# Patient Record
Sex: Male | Born: 1975
Health system: Southern US, Community
[De-identification: ages and names within clinical notes are randomized; demographics above are authoritative.]

## PROBLEM LIST (undated history)

## (undated) DIAGNOSIS — K635 Polyp of colon: Secondary | ICD-10-CM

## (undated) DIAGNOSIS — G4730 Sleep apnea, unspecified: Secondary | ICD-10-CM

## (undated) DIAGNOSIS — K439 Ventral hernia without obstruction or gangrene: Secondary | ICD-10-CM

## (undated) DIAGNOSIS — Z720 Tobacco use: Secondary | ICD-10-CM

## (undated) DIAGNOSIS — J45909 Unspecified asthma, uncomplicated: Secondary | ICD-10-CM

## (undated) DIAGNOSIS — I10 Essential (primary) hypertension: Secondary | ICD-10-CM

## (undated) DIAGNOSIS — F952 Tourette's disorder: Secondary | ICD-10-CM

## (undated) HISTORY — DX: Tobacco use: Z72.0

## (undated) HISTORY — PX: ABDOMINAL HERNIA REPAIR: SHX539

## (undated) HISTORY — DX: Tourette's disorder: F95.2

## (undated) HISTORY — DX: Polyp of colon: K63.5

## (undated) HISTORY — DX: Unspecified asthma, uncomplicated: J45.909

## (undated) HISTORY — DX: Ventral hernia without obstruction or gangrene: K43.9

## (undated) HISTORY — DX: Essential (primary) hypertension: I10

## (undated) HISTORY — DX: Morbid (severe) obesity due to excess calories: E66.01

---

## 2003-11-28 ENCOUNTER — Other Ambulatory Visit: Payer: Self-pay

## 2006-08-07 HISTORY — PX: COLONOSCOPY: SHX174

## 2006-11-21 ENCOUNTER — Emergency Department: Payer: Self-pay | Admitting: Emergency Medicine

## 2007-03-20 ENCOUNTER — Ambulatory Visit: Payer: Self-pay | Admitting: Gastroenterology

## 2007-06-17 ENCOUNTER — Ambulatory Visit: Payer: Self-pay | Admitting: Family Medicine

## 2007-06-17 DIAGNOSIS — J45909 Unspecified asthma, uncomplicated: Secondary | ICD-10-CM | POA: Insufficient documentation

## 2007-06-17 DIAGNOSIS — F952 Tourette's disorder: Secondary | ICD-10-CM

## 2007-06-17 LAB — CONVERTED CEMR LAB
Glucose, Urine, Semiquant: NEGATIVE
Ketones, urine, test strip: NEGATIVE
Protein, U semiquant: NEGATIVE
Specific Gravity, Urine: 1.01
Urobilinogen, UA: 0.2

## 2007-06-18 ENCOUNTER — Encounter (INDEPENDENT_AMBULATORY_CARE_PROVIDER_SITE_OTHER): Payer: Self-pay | Admitting: Family Medicine

## 2007-06-19 ENCOUNTER — Telehealth (INDEPENDENT_AMBULATORY_CARE_PROVIDER_SITE_OTHER): Payer: Self-pay | Admitting: *Deleted

## 2007-06-19 LAB — CONVERTED CEMR LAB
ALT: 30 units/L (ref 0–53)
Albumin: 4.3 g/dL (ref 3.5–5.2)
Alkaline Phosphatase: 123 units/L — ABNORMAL HIGH (ref 39–117)
Basophils Absolute: 0 10*3/uL (ref 0.0–0.1)
CO2: 25 meq/L (ref 19–32)
Creatinine, Ser: 1.12 mg/dL (ref 0.40–1.50)
Eosinophils Absolute: 0.1 10*3/uL — ABNORMAL LOW (ref 0.2–0.7)
Glucose, Bld: 94 mg/dL (ref 70–99)
Hemoglobin: 14.5 g/dL (ref 13.0–17.0)
LDL Cholesterol: 124 mg/dL — ABNORMAL HIGH (ref 0–99)
Platelets: 181 10*3/uL (ref 150–400)
Potassium: 4.1 meq/L (ref 3.5–5.3)
RBC: 5.07 M/uL (ref 4.22–5.81)
TSH: 2.459 microintl units/mL (ref 0.350–5.50)
Total Bilirubin: 0.7 mg/dL (ref 0.3–1.2)
Total CHOL/HDL Ratio: 3.8
Triglycerides: 94 mg/dL (ref <150)
VLDL: 19 mg/dL (ref 0–40)

## 2007-06-27 ENCOUNTER — Encounter (INDEPENDENT_AMBULATORY_CARE_PROVIDER_SITE_OTHER): Payer: Self-pay | Admitting: Family Medicine

## 2007-07-09 ENCOUNTER — Encounter (INDEPENDENT_AMBULATORY_CARE_PROVIDER_SITE_OTHER): Payer: Self-pay | Admitting: Family Medicine

## 2007-07-18 ENCOUNTER — Ambulatory Visit: Payer: Self-pay | Admitting: Family Medicine

## 2007-07-18 DIAGNOSIS — I10 Essential (primary) hypertension: Secondary | ICD-10-CM | POA: Insufficient documentation

## 2007-07-18 LAB — CONVERTED CEMR LAB
Cholesterol, target level: 200 mg/dL
HDL goal, serum: 40 mg/dL
LDL Goal: 160 mg/dL

## 2007-08-27 ENCOUNTER — Emergency Department (HOSPITAL_COMMUNITY): Admission: EM | Admit: 2007-08-27 | Discharge: 2007-08-28 | Payer: Self-pay | Admitting: Emergency Medicine

## 2007-12-19 ENCOUNTER — Emergency Department (HOSPITAL_COMMUNITY): Admission: EM | Admit: 2007-12-19 | Discharge: 2007-12-20 | Payer: Self-pay | Admitting: Emergency Medicine

## 2008-02-08 ENCOUNTER — Emergency Department (HOSPITAL_COMMUNITY): Admission: EM | Admit: 2008-02-08 | Discharge: 2008-02-08 | Payer: Self-pay | Admitting: Emergency Medicine

## 2008-02-19 ENCOUNTER — Ambulatory Visit: Payer: Self-pay | Admitting: Family Medicine

## 2008-03-30 ENCOUNTER — Ambulatory Visit: Payer: Self-pay | Admitting: Family Medicine

## 2008-05-27 ENCOUNTER — Encounter (INDEPENDENT_AMBULATORY_CARE_PROVIDER_SITE_OTHER): Payer: Self-pay | Admitting: Family Medicine

## 2008-06-23 ENCOUNTER — Ambulatory Visit: Payer: Self-pay | Admitting: Family Medicine

## 2008-06-23 LAB — CONVERTED CEMR LAB
Blood in Urine, dipstick: NEGATIVE
Glucose, Urine, Semiquant: NEGATIVE
Ketones, urine, test strip: NEGATIVE
WBC Urine, dipstick: NEGATIVE
pH: 7

## 2008-07-08 ENCOUNTER — Telehealth (INDEPENDENT_AMBULATORY_CARE_PROVIDER_SITE_OTHER): Payer: Self-pay | Admitting: Family Medicine

## 2008-07-10 ENCOUNTER — Encounter (INDEPENDENT_AMBULATORY_CARE_PROVIDER_SITE_OTHER): Payer: Self-pay | Admitting: *Deleted

## 2008-07-10 ENCOUNTER — Encounter (INDEPENDENT_AMBULATORY_CARE_PROVIDER_SITE_OTHER): Payer: Self-pay | Admitting: Family Medicine

## 2008-08-28 ENCOUNTER — Ambulatory Visit: Payer: Self-pay | Admitting: Family Medicine

## 2008-08-29 ENCOUNTER — Encounter (INDEPENDENT_AMBULATORY_CARE_PROVIDER_SITE_OTHER): Payer: Self-pay | Admitting: Family Medicine

## 2008-08-31 ENCOUNTER — Telehealth (INDEPENDENT_AMBULATORY_CARE_PROVIDER_SITE_OTHER): Payer: Self-pay | Admitting: *Deleted

## 2008-08-31 LAB — CONVERTED CEMR LAB
AST: 19 units/L (ref 0–37)
Albumin: 4.2 g/dL (ref 3.5–5.2)
Alkaline Phosphatase: 139 units/L — ABNORMAL HIGH (ref 39–117)
Basophils Relative: 1 % (ref 0–1)
CO2: 20 meq/L (ref 19–32)
Cholesterol: 171 mg/dL (ref 0–200)
Creatinine, Ser: 1.01 mg/dL (ref 0.40–1.50)
HDL: 54 mg/dL (ref >39)
Lymphocytes Relative: 33 % (ref 12–46)
MCHC: 34 g/dL (ref 30.0–36.0)
MCV: 82.1 fL (ref 78.0–100.0)
Neutro Abs: 2.3 10*3/uL (ref 1.7–7.7)
Platelets: 202 10*3/uL (ref 150–400)
RBC: 5.19 M/uL (ref 4.22–5.81)
Triglycerides: 85 mg/dL (ref <150)
WBC: 4.3 10*3/uL (ref 4.0–10.5)

## 2008-09-11 ENCOUNTER — Emergency Department (HOSPITAL_COMMUNITY): Admission: EM | Admit: 2008-09-11 | Discharge: 2008-09-11 | Payer: Self-pay | Admitting: Emergency Medicine

## 2008-09-18 ENCOUNTER — Telehealth (INDEPENDENT_AMBULATORY_CARE_PROVIDER_SITE_OTHER): Payer: Self-pay | Admitting: *Deleted

## 2008-10-02 ENCOUNTER — Emergency Department (HOSPITAL_COMMUNITY): Admission: EM | Admit: 2008-10-02 | Discharge: 2008-10-02 | Payer: Self-pay | Admitting: Emergency Medicine

## 2008-10-30 ENCOUNTER — Emergency Department (HOSPITAL_COMMUNITY): Admission: EM | Admit: 2008-10-30 | Discharge: 2008-10-30 | Payer: Self-pay | Admitting: Emergency Medicine

## 2008-11-30 ENCOUNTER — Encounter (INDEPENDENT_AMBULATORY_CARE_PROVIDER_SITE_OTHER): Payer: Self-pay | Admitting: Family Medicine

## 2009-02-11 ENCOUNTER — Ambulatory Visit: Payer: Self-pay | Admitting: Family Medicine

## 2009-02-12 ENCOUNTER — Encounter (INDEPENDENT_AMBULATORY_CARE_PROVIDER_SITE_OTHER): Payer: Self-pay | Admitting: Family Medicine

## 2009-02-12 LAB — CONVERTED CEMR LAB
Alkaline Phosphatase: 137 units/L — ABNORMAL HIGH (ref 39–117)
CO2: 22 meq/L (ref 19–32)
Calcium: 9 mg/dL (ref 8.4–10.5)
Creatinine, Ser: 1 mg/dL (ref 0.40–1.50)
Glucose, Bld: 92 mg/dL (ref 70–99)
Potassium: 4.2 meq/L (ref 3.5–5.3)
Total Bilirubin: 0.5 mg/dL (ref 0.3–1.2)
Total Protein: 7.2 g/dL (ref 6.0–8.3)

## 2009-03-15 ENCOUNTER — Telehealth (INDEPENDENT_AMBULATORY_CARE_PROVIDER_SITE_OTHER): Payer: Self-pay | Admitting: Family Medicine

## 2009-05-06 ENCOUNTER — Emergency Department (HOSPITAL_COMMUNITY): Admission: EM | Admit: 2009-05-06 | Discharge: 2009-05-06 | Payer: Self-pay | Admitting: Emergency Medicine

## 2009-05-10 ENCOUNTER — Emergency Department (HOSPITAL_COMMUNITY): Admission: EM | Admit: 2009-05-10 | Discharge: 2009-05-10 | Payer: Self-pay | Admitting: Emergency Medicine

## 2009-08-07 HISTORY — PX: COLONOSCOPY: SHX174

## 2009-12-06 ENCOUNTER — Ambulatory Visit: Payer: Self-pay | Admitting: Gastroenterology

## 2010-08-17 ENCOUNTER — Emergency Department (HOSPITAL_COMMUNITY)
Admission: EM | Admit: 2010-08-17 | Discharge: 2010-08-17 | Payer: Self-pay | Source: Home / Self Care | Admitting: Emergency Medicine

## 2010-09-20 ENCOUNTER — Emergency Department (HOSPITAL_COMMUNITY)
Admission: EM | Admit: 2010-09-20 | Discharge: 2010-09-20 | Disposition: A | Discharge status: Home / Self Care | Payer: Medicare Other | Attending: Emergency Medicine | Admitting: Emergency Medicine

## 2010-09-20 ENCOUNTER — Emergency Department (HOSPITAL_COMMUNITY): Payer: Medicare Other

## 2010-09-20 DIAGNOSIS — Z79899 Other long term (current) drug therapy: Secondary | ICD-10-CM | POA: Insufficient documentation

## 2010-09-20 DIAGNOSIS — J45909 Unspecified asthma, uncomplicated: Secondary | ICD-10-CM | POA: Insufficient documentation

## 2010-09-20 DIAGNOSIS — I4949 Other premature depolarization: Secondary | ICD-10-CM | POA: Insufficient documentation

## 2010-09-20 LAB — RAPID URINE DRUG SCREEN, HOSP PERFORMED
Amphetamines: NOT DETECTED
Cocaine: NOT DETECTED
Opiates: NOT DETECTED

## 2010-09-20 LAB — URINALYSIS, ROUTINE W REFLEX MICROSCOPIC
Bilirubin Urine: NEGATIVE
Hgb urine dipstick: NEGATIVE
Ketones, ur: NEGATIVE mg/dL
Protein, ur: NEGATIVE mg/dL
Urine Glucose, Fasting: NEGATIVE mg/dL

## 2010-09-20 LAB — DIFFERENTIAL
Eosinophils Absolute: 0.2 10*3/uL (ref 0.0–0.7)
Lymphocytes Relative: 31 % (ref 12–46)
Lymphs Abs: 1.6 10*3/uL (ref 0.7–4.0)
Monocytes Absolute: 0.6 10*3/uL (ref 0.1–1.0)
Neutro Abs: 2.8 10*3/uL (ref 1.7–7.7)
Neutrophils Relative %: 54 % (ref 43–77)

## 2010-09-20 LAB — COMPREHENSIVE METABOLIC PANEL
ALT: 42 U/L (ref 0–53)
BUN: 9 mg/dL (ref 6–23)
Chloride: 101 mEq/L (ref 96–112)
Sodium: 139 mEq/L (ref 135–145)
Total Bilirubin: 0.7 mg/dL (ref 0.3–1.2)
Total Protein: 7.3 g/dL (ref 6.0–8.3)

## 2010-09-20 LAB — CBC
HCT: 43.2 % (ref 39.0–52.0)
Hemoglobin: 14.2 g/dL (ref 13.0–17.0)
MCH: 28 pg (ref 26.0–34.0)
MCHC: 32.9 g/dL (ref 30.0–36.0)
MCV: 85.2 fL (ref 78.0–100.0)
RBC: 5.07 MIL/uL (ref 4.22–5.81)
WBC: 5.2 10*3/uL (ref 4.0–10.5)

## 2010-09-20 LAB — POCT CARDIAC MARKERS: CKMB, poc: <1 ng/mL — ABNORMAL LOW (ref 1.0–8.0)

## 2010-11-10 LAB — CBC
HCT: 37 % — ABNORMAL LOW (ref 39.0–52.0)
Hemoglobin: 12.7 g/dL — ABNORMAL LOW (ref 13.0–17.0)
MCV: 84.6 fL (ref 78.0–100.0)
Platelets: 183 10*3/uL (ref 150–400)
RDW: 13.8 % (ref 11.5–15.5)

## 2010-11-10 LAB — BASIC METABOLIC PANEL
BUN: 11 mg/dL (ref 6–23)
Calcium: 8.9 mg/dL (ref 8.4–10.5)
GFR calc Af Amer: >60 mL/min (ref >60)
Glucose, Bld: 127 mg/dL — ABNORMAL HIGH (ref 70–99)
Potassium: 3.6 mEq/L (ref 3.5–5.1)
Sodium: 138 mEq/L (ref 135–145)

## 2010-11-10 LAB — DIFFERENTIAL
Lymphs Abs: 1.5 10*3/uL (ref 0.7–4.0)
Neutro Abs: 2.3 10*3/uL (ref 1.7–7.7)

## 2010-11-22 LAB — CBC
HCT: 41.6 % (ref 39.0–52.0)
Hemoglobin: 14 g/dL (ref 13.0–17.0)
MCHC: 33.8 g/dL (ref 30.0–36.0)
MCV: 85.6 fL (ref 78.0–100.0)
Platelets: 196 10*3/uL (ref 150–400)
RBC: 4.86 MIL/uL (ref 4.22–5.81)
RDW: 13.7 % (ref 11.5–15.5)

## 2010-11-22 LAB — DIFFERENTIAL
Basophils Absolute: 0.1 10*3/uL (ref 0.0–0.1)
Basophils Relative: 1 % (ref 0–1)
Eosinophils Relative: 4 % (ref 0–5)
Lymphs Abs: 1.4 10*3/uL (ref 0.7–4.0)
Monocytes Absolute: 0.5 10*3/uL (ref 0.1–1.0)
Neutro Abs: 2.7 10*3/uL (ref 1.7–7.7)

## 2010-11-22 LAB — BASIC METABOLIC PANEL
BUN: 9 mg/dL (ref 6–23)
CO2: 25 mEq/L (ref 19–32)
Creatinine, Ser: 1.02 mg/dL (ref 0.4–1.5)
Glucose, Bld: 104 mg/dL — ABNORMAL HIGH (ref 70–99)
Potassium: 3.5 mEq/L (ref 3.5–5.1)
Sodium: 138 mEq/L (ref 135–145)

## 2010-12-09 ENCOUNTER — Emergency Department (HOSPITAL_COMMUNITY)
Admission: EM | Admit: 2010-12-09 | Discharge: 2010-12-09 | Disposition: A | Discharge status: Home / Self Care | Payer: Medicare Other | Attending: Emergency Medicine | Admitting: Emergency Medicine

## 2010-12-09 DIAGNOSIS — K089 Disorder of teeth and supporting structures, unspecified: Secondary | ICD-10-CM | POA: Insufficient documentation

## 2011-01-13 ENCOUNTER — Emergency Department (HOSPITAL_COMMUNITY)
Admission: EM | Admit: 2011-01-13 | Discharge: 2011-01-13 | Disposition: A | Discharge status: Home / Self Care | Payer: Medicare Other | Attending: Emergency Medicine | Admitting: Emergency Medicine

## 2011-01-13 DIAGNOSIS — K089 Disorder of teeth and supporting structures, unspecified: Secondary | ICD-10-CM | POA: Insufficient documentation

## 2012-01-31 ENCOUNTER — Telehealth (HOSPITAL_COMMUNITY): Payer: Self-pay | Admitting: Dietician

## 2012-01-31 NOTE — Telephone Encounter (Signed)
Pt requesting to attend "free classes" at hospital. Offered to register pt for diabetes class, but pt declined, reporting he does not have diabetes. Upon further questioning, pt reports that he desires weight loss. He states he wants "to lose over 100#" and he has been talking to trainers who advised him to eat 6 times a day. He reports he has started walking for 15-20 minutes 3 times per week. Provided basic weight loss advice to pt, emphasized tracking calorie intake, exercising more (goal of 30 minutes 5 times per week), and monitoring food choices (reducing intake of high calorie foods and beverages). Also advised pt about MyFitness Pal app. Pt requesting "a list of foods to eat to lose weight". Mailed pt handouts from [employer] Nutrition Care Manual re: weight management (1800 Calorie Sample 5 day menu, Cooking Tips for Weight Management, Label Reading Tips for Weight Management, and Weight Loss Tips) to address pt requested ([address], [place], [place]).

## 2012-05-26 DIAGNOSIS — IMO0002 Reserved for concepts with insufficient information to code with codable children: Secondary | ICD-10-CM | POA: Diagnosis not present

## 2012-05-26 DIAGNOSIS — Z79899 Other long term (current) drug therapy: Secondary | ICD-10-CM | POA: Diagnosis not present

## 2012-05-26 DIAGNOSIS — I10 Essential (primary) hypertension: Secondary | ICD-10-CM | POA: Diagnosis not present

## 2012-05-26 DIAGNOSIS — Z888 Allergy status to other drugs, medicaments and biological substances status: Secondary | ICD-10-CM | POA: Diagnosis not present

## 2012-05-26 DIAGNOSIS — F17200 Nicotine dependence, unspecified, uncomplicated: Secondary | ICD-10-CM | POA: Diagnosis not present

## 2012-05-26 DIAGNOSIS — T783XXA Angioneurotic edema, initial encounter: Secondary | ICD-10-CM | POA: Diagnosis not present

## 2012-05-28 DIAGNOSIS — Z888 Allergy status to other drugs, medicaments and biological substances status: Secondary | ICD-10-CM | POA: Diagnosis not present

## 2012-05-28 DIAGNOSIS — F952 Tourette's disorder: Secondary | ICD-10-CM | POA: Diagnosis not present

## 2012-05-28 DIAGNOSIS — I10 Essential (primary) hypertension: Secondary | ICD-10-CM | POA: Diagnosis not present

## 2012-05-29 DIAGNOSIS — Z23 Encounter for immunization: Secondary | ICD-10-CM | POA: Diagnosis not present

## 2012-07-12 DIAGNOSIS — I10 Essential (primary) hypertension: Secondary | ICD-10-CM | POA: Diagnosis not present

## 2012-07-18 DIAGNOSIS — F17200 Nicotine dependence, unspecified, uncomplicated: Secondary | ICD-10-CM | POA: Diagnosis not present

## 2012-07-18 DIAGNOSIS — H6120 Impacted cerumen, unspecified ear: Secondary | ICD-10-CM | POA: Diagnosis not present

## 2012-07-18 DIAGNOSIS — I10 Essential (primary) hypertension: Secondary | ICD-10-CM | POA: Diagnosis not present

## 2012-07-18 DIAGNOSIS — Z79899 Other long term (current) drug therapy: Secondary | ICD-10-CM | POA: Diagnosis not present

## 2012-07-18 DIAGNOSIS — IMO0002 Reserved for concepts with insufficient information to code with codable children: Secondary | ICD-10-CM | POA: Diagnosis not present

## 2012-08-28 DIAGNOSIS — I10 Essential (primary) hypertension: Secondary | ICD-10-CM | POA: Diagnosis not present

## 2012-08-28 DIAGNOSIS — F952 Tourette's disorder: Secondary | ICD-10-CM | POA: Diagnosis not present

## 2012-08-28 DIAGNOSIS — Z0000 Encounter for general adult medical examination without abnormal findings: Secondary | ICD-10-CM | POA: Diagnosis not present

## 2012-08-28 DIAGNOSIS — F17200 Nicotine dependence, unspecified, uncomplicated: Secondary | ICD-10-CM | POA: Diagnosis not present

## 2012-10-29 ENCOUNTER — Ambulatory Visit: Payer: Medicare Other | Admitting: Gastroenterology

## 2012-11-13 ENCOUNTER — Ambulatory Visit (INDEPENDENT_AMBULATORY_CARE_PROVIDER_SITE_OTHER): Payer: Medicare Other | Admitting: Gastroenterology

## 2012-11-13 ENCOUNTER — Encounter: Payer: Self-pay | Admitting: Gastroenterology

## 2012-11-13 VITALS — BP 147/91 | HR 93 | Temp 97.9°F | Ht 73.0 in | Wt 391.6 lb

## 2012-11-13 DIAGNOSIS — R945 Abnormal results of liver function studies: Secondary | ICD-10-CM | POA: Insufficient documentation

## 2012-11-13 DIAGNOSIS — Z860100 Personal history of colon polyps, unspecified: Secondary | ICD-10-CM | POA: Insufficient documentation

## 2012-11-13 DIAGNOSIS — F17200 Nicotine dependence, unspecified, uncomplicated: Secondary | ICD-10-CM

## 2012-11-13 DIAGNOSIS — R7989 Other specified abnormal findings of blood chemistry: Secondary | ICD-10-CM | POA: Diagnosis not present

## 2012-11-13 DIAGNOSIS — Z720 Tobacco use: Secondary | ICD-10-CM

## 2012-11-13 NOTE — Assessment & Plan Note (Signed)
BMI greater than 50. Discussed at length the need for weight loss. Patient has added exercise regimen but needs to work on his dietary intake.

## 2012-11-13 NOTE — Progress Notes (Signed)
Primary Care Physician:  Milana Obey, MD  Primary Gastroenterologist:  Roetta Sessions, MD   Chief Complaint  Patient presents with  . Colonoscopy    HPI:  Jimmy Miller is a 37 y.o. male here as a self-referral to schedule surveillance colonoscopy. Patient states he has had 2 colonoscopy with multiple polyps removed back in 2008 and 2011 at [facility]. He states he was told come back in 3 years. Denies any family history of colon cancer. Denies constipation, diarrhea, melena, rectal bleeding, abdominal pain, heartburn, vomiting. He is making a conscious effort to try and loose some weight. He has started an exercise regimen, walking at least 30 minutes most days.  In January he had routine blood work done for physical. I note that he had mildly elevated alkaline phosphatase in the 140s. We will recheck at this time.  Current Outpatient Prescriptions  Medication Sig Dispense Refill  . ADVAIR DISKUS 250-50 MCG/DOSE AEPB 1 puff.       Marland Kitchen amLODipine (NORVASC) 10 MG tablet Take 10 mg by mouth daily.       . hydrochlorothiazide (HYDRODIURIL) 25 MG tablet Take 25 mg by mouth daily.       . risperiDONE (RISPERDAL) 2 MG tablet Take 2 mg by mouth 3 (three) times daily.       . potassium chloride SA (K-DUR,KLOR-CON) 20 MEQ tablet 20 mEq daily.       No current facility-administered medications for this visit.    Allergies as of 11/13/2012 - Review Complete 11/13/2012  Allergen Reaction Noted  . Benazepril Swelling 11/13/2012    Past Medical History  Diagnosis Date  . HTN (hypertension)   . Tourette disorder   . Tobacco abuse   . Colon polyp   . Obesity, morbid, BMI 50 or higher   . Asthma   . Ventral hernia     Past Surgical History  Procedure Laterality Date  . Colonoscopy    . Abdominal hernia repair      as a baby    Family History  Problem Relation Age of Onset  . Colon cancer Neg Hx   . Cancer Maternal Grandfather   . Liver disease Neg Hx      History   Social History  . Marital Status: Legally Separated    Spouse Name: N/A    Number of Children: 0  . Years of Education: N/A   Occupational History  . disability    Social History Main Topics  . Smoking status: Current Every Day Smoker -- 0.50 packs/day    Types: Cigarettes  . Smokeless tobacco: Not on file  . Alcohol Use: No  . Drug Use: No  . Sexually Active: Not on file   Other Topics Concern  . Not on file   Social History Narrative  . No narrative on file      ROS:  General: Negative for anorexia, weight loss, fever, chills, fatigue, weakness. Eyes: Negative for vision changes.  ENT: Negative for hoarseness, difficulty swallowing , nasal congestion. CV: Negative for chest pain, angina, palpitations, dyspnea on exertion, peripheral edema.  Respiratory: Negative for dyspnea at rest, dyspnea on exertion, cough, sputum, wheezing.  GI: See history of present illness. GU:  Negative for dysuria, hematuria, urinary incontinence, urinary frequency, nocturnal urination.  MS: Negative for joint pain, low back pain.  Derm: Negative for rash or itching.  Neuro: Negative for weakness, abnormal sensation, seizure, frequent headaches, memory loss, confusion. Tourette's syndrome Psych: Negative for anxiety, depression, suicidal  ideation, hallucinations.  Endo: Negative for unusual weight change.  Heme: Negative for bruising or bleeding. Allergy: Negative for rash or hives.    Physical Examination:  BP 147/91  Pulse 93  Temp(Src) 97.9 F (36.6 C) (Oral)  Ht 6\' 1"  (1.854 m)  Wt 391 lb 9.6 oz (177.629 kg)  BMI 51.68 kg/m2   General: Well-nourished, well-developed in no acute distress. Notable Tourettes with verbal and facial ticks. Head: Normocephalic, atraumatic.   Eyes: Conjunctiva pink, no icterus. Mouth: Oropharyngeal mucosa moist and pink , no lesions erythema or exudate. Neck: Supple without thyromegaly, masses, or lymphadenopathy.  Lungs: Clear to  auscultation bilaterally.  Heart: Regular rate and rhythm, no murmurs rubs or gallops.  Abdomen: Bowel sounds are normal, nontender, nondistended, no hepatosplenomegaly or masses, no abdominal bruits or    hernia , no rebound or guarding.   Rectal: not performed Extremities: No lower extremity edema. No clubbing or deformities.  Neuro: Alert and oriented x 4 , grossly normal neurologically.  Skin: Warm and dry, no rash or jaundice.   Psych: Alert and cooperative, normal mood and affect.  Labs: Labs from January 2014. Total cholesterol 203, LDL 132, glucose 91, BUN 11, creatinine 0.99, sodium 137, potassium 3.9, albumin 4.5, total bilirubin 0.7, alkaline phosphatase 144, AST 22, ALT 37, TSH 1.73, white blood cell count 5200, hemoglobin 15.1, MCV 84.1, platelets 184,000.  Imaging Studies: No results found.

## 2012-11-13 NOTE — Assessment & Plan Note (Signed)
Elevated alkaline phosphatase, unclear etiology. Simply repeat at this time, if remains elevated, consider further workup. Discussed at length with patient.

## 2012-11-13 NOTE — Patient Instructions (Addendum)
Please have your blood work done. We will request records from [facility] regarding prior colonoscopy. Once records have been received, we will schedule you for a colonoscopy.

## 2012-11-13 NOTE — Assessment & Plan Note (Signed)
Patient reports history of multiple colonic polyps requiring three-year surveillance colonoscopies. We will request records prior to scheduling his next colonoscopy with Dr. Jena Gauss.

## 2012-11-13 NOTE — Progress Notes (Signed)
Cc PCP 

## 2012-11-14 LAB — COMPREHENSIVE METABOLIC PANEL
ALT: 37 U/L (ref 10–40)
Albumin: 4.5
Alkaline Phosphatase: 144 U/L
Sodium: 137 mmol/L (ref 137–147)
Total Bilirubin: 0.7 mg/dL

## 2012-11-14 LAB — HEPATIC FUNCTION PANEL
ALT: 35 U/L (ref 0–53)
AST: 23 U/L (ref 0–37)
Albumin: 4.2 g/dL (ref 3.5–5.2)
Alkaline Phosphatase: 118 U/L — ABNORMAL HIGH (ref 39–117)
Total Bilirubin: 0.6 mg/dL (ref 0.3–1.2)

## 2012-11-14 LAB — CBC
Cholesterol, Total: 203
HCT: 44 %
LDL Cholesterol: 132 mg/dL
MCV: 84.1 fL

## 2012-11-27 ENCOUNTER — Other Ambulatory Visit: Payer: Self-pay | Admitting: Internal Medicine

## 2012-11-27 ENCOUNTER — Encounter: Payer: Self-pay | Admitting: Gastroenterology

## 2012-11-27 DIAGNOSIS — Z86010 Personal history of colonic polyps: Secondary | ICD-10-CM

## 2012-11-27 MED ORDER — PEG 3350-KCL-NA BICARB-NACL 420 G PO SOLR: 4000.0000 mL | ORAL | Status: DC

## 2012-11-27 NOTE — Progress Notes (Signed)
Quick Note:  AP much better. Only one point above normal. No further w/u. Reviewed records from [place]. Colonoscopy, 12/06/2009, 9 mm polyp in the rectum which was removed. Internal hemorrhoids. Diverticulosis. Pathology revealed tubulovillous adenoma negative for high grade dysplasia and malignancy.Advised to have repeat TCS 3 years.  Colonoscopy, 03/20/2007, 9 mm polyp in the transverse colon resected. Erythematous mucosa in the ileocecal valve. Transverse colon polyp showed histological features of a juvenile polyp, focal noncaseating granuloma. ICV showed lymphoid aggregate with no active inflammation or granulomas. Sedation: MAC  Please schedule patient for a colonoscopy with Dr. Jena Gauss. ______

## 2012-11-27 NOTE — Progress Notes (Signed)
I have patient scheduled for TCS w/RMR on Wednesday 5/7 and I have mailed him the instructions and also [facility] for patient to call and confirm time

## 2012-11-28 ENCOUNTER — Encounter (HOSPITAL_COMMUNITY): Payer: Self-pay | Admitting: Pharmacy Technician

## 2012-11-29 DIAGNOSIS — I10 Essential (primary) hypertension: Secondary | ICD-10-CM | POA: Diagnosis not present

## 2012-12-11 ENCOUNTER — Ambulatory Visit (HOSPITAL_COMMUNITY)
Admission: RE | Admit: 2012-12-11 | Discharge: 2012-12-11 | Disposition: A | Discharge status: Home / Self Care | Payer: Medicare Other | Source: Ambulatory Visit | Attending: Internal Medicine | Admitting: Internal Medicine

## 2012-12-11 ENCOUNTER — Encounter (HOSPITAL_COMMUNITY): Payer: Self-pay | Admitting: *Deleted

## 2012-12-11 ENCOUNTER — Encounter (HOSPITAL_COMMUNITY)
Admission: RE | Disposition: A | Discharge status: Home / Self Care | Payer: Self-pay | Source: Ambulatory Visit | Attending: Internal Medicine

## 2012-12-11 DIAGNOSIS — J45909 Unspecified asthma, uncomplicated: Secondary | ICD-10-CM | POA: Diagnosis not present

## 2012-12-11 DIAGNOSIS — I10 Essential (primary) hypertension: Secondary | ICD-10-CM | POA: Diagnosis not present

## 2012-12-11 DIAGNOSIS — F952 Tourette's disorder: Secondary | ICD-10-CM | POA: Insufficient documentation

## 2012-12-11 DIAGNOSIS — Z79899 Other long term (current) drug therapy: Secondary | ICD-10-CM | POA: Insufficient documentation

## 2012-12-11 DIAGNOSIS — Z1211 Encounter for screening for malignant neoplasm of colon: Secondary | ICD-10-CM | POA: Diagnosis not present

## 2012-12-11 DIAGNOSIS — Z860100 Personal history of colon polyps, unspecified: Secondary | ICD-10-CM | POA: Insufficient documentation

## 2012-12-11 DIAGNOSIS — K5730 Diverticulosis of large intestine without perforation or abscess without bleeding: Secondary | ICD-10-CM | POA: Insufficient documentation

## 2012-12-11 DIAGNOSIS — Z6841 Body Mass Index (BMI) 40.0 and over, adult: Secondary | ICD-10-CM | POA: Diagnosis not present

## 2012-12-11 DIAGNOSIS — R748 Abnormal levels of other serum enzymes: Secondary | ICD-10-CM | POA: Insufficient documentation

## 2012-12-11 DIAGNOSIS — IMO0002 Reserved for concepts with insufficient information to code with codable children: Secondary | ICD-10-CM | POA: Diagnosis not present

## 2012-12-11 DIAGNOSIS — F17200 Nicotine dependence, unspecified, uncomplicated: Secondary | ICD-10-CM | POA: Diagnosis not present

## 2012-12-11 DIAGNOSIS — Z888 Allergy status to other drugs, medicaments and biological substances status: Secondary | ICD-10-CM | POA: Diagnosis not present

## 2012-12-11 HISTORY — PX: COLONOSCOPY: SHX5424

## 2012-12-11 SURGERY — COLONOSCOPY
Anesthesia: Moderate Sedation

## 2012-12-11 MED ORDER — PROMETHAZINE HCL 25 MG/ML IJ SOLN
INTRAMUSCULAR | Status: AC
  Filled 2012-12-11: qty 1

## 2012-12-11 MED ORDER — MIDAZOLAM HCL 5 MG/5ML IJ SOLN
INTRAMUSCULAR | Status: DC | PRN
  Administered 2012-12-11: 2 mg via INTRAVENOUS
  Administered 2012-12-11: 1 mg via INTRAVENOUS
  Administered 2012-12-11 (×2): 2 mg via INTRAVENOUS

## 2012-12-11 MED ORDER — MEPERIDINE HCL 100 MG/ML IJ SOLN
INTRAMUSCULAR | Status: AC
  Filled 2012-12-11: qty 2

## 2012-12-11 MED ORDER — SODIUM CHLORIDE 0.9 % IV SOLN
INTRAVENOUS | Status: DC
  Administered 2012-12-11: 12:00:00 via INTRAVENOUS

## 2012-12-11 MED ORDER — ONDANSETRON HCL 4 MG/2ML IJ SOLN
INTRAMUSCULAR | Status: DC | PRN
  Administered 2012-12-11: 4 mg via INTRAVENOUS

## 2012-12-11 MED ORDER — MIDAZOLAM HCL 5 MG/5ML IJ SOLN
INTRAMUSCULAR | Status: AC
  Filled 2012-12-11: qty 10

## 2012-12-11 MED ORDER — ONDANSETRON HCL 4 MG/2ML IJ SOLN
INTRAMUSCULAR | Status: AC
  Filled 2012-12-11: qty 2

## 2012-12-11 MED ORDER — STERILE WATER FOR IRRIGATION IR SOLN
Status: DC | PRN
  Administered 2012-12-11: 13:00:00

## 2012-12-11 MED ORDER — MEPERIDINE HCL 100 MG/ML IJ SOLN
INTRAMUSCULAR | Status: DC | PRN
  Administered 2012-12-11 (×3): 50 mg via INTRAVENOUS

## 2012-12-11 MED ORDER — PROMETHAZINE HCL 25 MG/ML IJ SOLN
INTRAMUSCULAR | Status: DC | PRN
  Administered 2012-12-11: 12.5 mg via INTRAVENOUS

## 2012-12-11 MED ORDER — SODIUM CHLORIDE 0.9 % IJ SOLN
INTRAMUSCULAR | Status: AC
  Filled 2012-12-11: qty 10

## 2012-12-11 NOTE — H&P (View-Only) (Signed)
Primary Care Physician:  KNOWLTON,STEPHEN D, MD  Primary Gastroenterologist:  Carnell Rourk, MD   Chief Complaint  Patient presents with  . Colonoscopy    HPI:  Jimmy Miller is a 37 y.o. male here as a self-referral to schedule surveillance colonoscopy. Patient states he has had 2 colonoscopy with multiple polyps removed back in 2008 and 2011 at [facility]. He states he was told come back in 3 years. Denies any family history of colon cancer. Denies constipation, diarrhea, melena, rectal bleeding, abdominal pain, heartburn, vomiting. He is making a conscious effort to try and loose some weight. He has started an exercise regimen, walking at least 30 minutes most days.  In January he had routine blood work done for physical. I note that he had mildly elevated alkaline phosphatase in the 140s. We will recheck at this time.  Current Outpatient Prescriptions  Medication Sig Dispense Refill  . ADVAIR DISKUS 250-50 MCG/DOSE AEPB 1 puff.       . amLODipine (NORVASC) 10 MG tablet Take 10 mg by mouth daily.       . hydrochlorothiazide (HYDRODIURIL) 25 MG tablet Take 25 mg by mouth daily.       . risperiDONE (RISPERDAL) 2 MG tablet Take 2 mg by mouth 3 (three) times daily.       . potassium chloride SA (K-DUR,KLOR-CON) 20 MEQ tablet 20 mEq daily.       No current facility-administered medications for this visit.    Allergies as of 11/13/2012 - Review Complete 11/13/2012  Allergen Reaction Noted  . Benazepril Swelling 11/13/2012    Past Medical History  Diagnosis Date  . HTN (hypertension)   . Tourette disorder   . Tobacco abuse   . Colon polyp   . Obesity, morbid, BMI 50 or higher   . Asthma   . Ventral hernia     Past Surgical History  Procedure Laterality Date  . Colonoscopy    . Abdominal hernia repair      as a baby    Family History  Problem Relation Age of Onset  . Colon cancer Neg Hx   . Cancer Maternal Grandfather   . Liver disease Neg Hx      History   Social History  . Marital Status: Legally Separated    Spouse Name: N/A    Number of Children: 0  . Years of Education: N/A   Occupational History  . disability    Social History Main Topics  . Smoking status: Current Every Day Smoker -- 0.50 packs/day    Types: Cigarettes  . Smokeless tobacco: Not on file  . Alcohol Use: No  . Drug Use: No  . Sexually Active: Not on file   Other Topics Concern  . Not on file   Social History Narrative  . No narrative on file      ROS:  General: Negative for anorexia, weight loss, fever, chills, fatigue, weakness. Eyes: Negative for vision changes.  ENT: Negative for hoarseness, difficulty swallowing , nasal congestion. CV: Negative for chest pain, angina, palpitations, dyspnea on exertion, peripheral edema.  Respiratory: Negative for dyspnea at rest, dyspnea on exertion, cough, sputum, wheezing.  GI: See history of present illness. GU:  Negative for dysuria, hematuria, urinary incontinence, urinary frequency, nocturnal urination.  MS: Negative for joint pain, low back pain.  Derm: Negative for rash or itching.  Neuro: Negative for weakness, abnormal sensation, seizure, frequent headaches, memory loss, confusion. Tourette's syndrome Psych: Negative for anxiety, depression, suicidal   ideation, hallucinations.  Endo: Negative for unusual weight change.  Heme: Negative for bruising or bleeding. Allergy: Negative for rash or hives.    Physical Examination:  BP 147/91  Pulse 93  Temp(Src) 97.9 F (36.6 C) (Oral)  Ht 6' 1" (1.854 m)  Wt 391 lb 9.6 oz (177.629 kg)  BMI 51.68 kg/m2   General: Well-nourished, well-developed in no acute distress. Notable Tourettes with verbal and facial ticks. Head: Normocephalic, atraumatic.   Eyes: Conjunctiva pink, no icterus. Mouth: Oropharyngeal mucosa moist and pink , no lesions erythema or exudate. Neck: Supple without thyromegaly, masses, or lymphadenopathy.  Lungs: Clear to  auscultation bilaterally.  Heart: Regular rate and rhythm, no murmurs rubs or gallops.  Abdomen: Bowel sounds are normal, nontender, nondistended, no hepatosplenomegaly or masses, no abdominal bruits or    hernia , no rebound or guarding.   Rectal: not performed Extremities: No lower extremity edema. No clubbing or deformities.  Neuro: Alert and oriented x 4 , grossly normal neurologically.  Skin: Warm and dry, no rash or jaundice.   Psych: Alert and cooperative, normal mood and affect.  Labs: Labs from January 2014. Total cholesterol 203, LDL 132, glucose 91, BUN 11, creatinine 0.99, sodium 137, potassium 3.9, albumin 4.5, total bilirubin 0.7, alkaline phosphatase 144, AST 22, ALT 37, TSH 1.73, white blood cell count 5200, hemoglobin 15.1, MCV 84.1, platelets 184,000.  Imaging Studies: No results found.    

## 2012-12-11 NOTE — Interval H&P Note (Signed)
History and Physical Interval Note:  12/11/2012 1:13 PM  Jimmy Miller  has presented today for surgery, with the diagnosis of PERSONAL HISTORY OF COLONIC POLYPS AND ABNORMAL LFT'S  The various methods of treatment have been discussed with the patient and family. After consideration of risks, benefits and other options for treatment, the patient has consented to  Procedure(s) with comments: COLONOSCOPY (N/A) - 12:30 as a surgical intervention .  The patient's history has been reviewed, patient examined, no change in status, stable for surgery.  I have reviewed the patient's chart and labs.  Questions were answered to the patient's satisfaction.     [name]  Tubulovillous adenoma 2011-Here for surveillance colonoscopy

## 2012-12-11 NOTE — Op Note (Signed)
[facility] [address] [place], [address]   COLONOSCOPY PROCEDURE REPORT  PATIENT: Jimmy, Miller  MR#:         [id number] BIRTHDATE: 1975-12-05 , 36  yrs. old GENDER: Male ENDOSCOPIST: R.  Roetta Sessions, MD FACP FACG REFERRED BY:  Gareth Morgan, M.D. PROCEDURE DATE:  12/11/2012 PROCEDURE:     Surveillance colonoscopy  INDICATIONS:  History tubulovillous adenoma removed (elsewhere) 20011  INFORMED CONSENT:  The risks, benefits, alternatives and imponderables including but not limited to bleeding, perforation as well as the possibility of a missed lesion have been reviewed.  The potential for biopsy, lesion removal, etc. have also been discussed.  Questions have been answered.  All parties agreeable. Please see the history and physical in the medical record for more information.  MEDICATIONS: Versed 8 mg IV and Demerol 100 mg IV in divided doses.. Zofran 4 mg  DESCRIPTION OF PROCEDURE:  After a digital rectal exam was performed, the EC-[id number] ([id number])  colonoscope was advanced from the anus through the rectum and colon to the area of the cecum, ileocecal valve and appendiceal orifice.  The cecum was deeply intubated.  These structures were well-seen and photographed for the record.  From the level of the cecum and ileocecal valve, the scope was slowly and cautiously withdrawn.  The mucosal surfaces were carefully surveyed utilizing scope tip deflection to facilitate fold flattening as needed.  The scope was pulled down into the rectum where a thorough examination including retroflexion was performed.    FINDINGS:  Normal rectum. Few scattered pancolonic diverticula; the remainder of the colonic mucosa appeared normal.  THERAPEUTIC / DIAGNOSTIC MANEUVERS PERFORMED:  none  COMPLICATIONS: none  CECAL WITHDRAWAL TIME:  7 minutes  IMPRESSION:  Colonic diverticulosis  RECOMMENDATIONS: Repeat surveillance colonoscopy in 5  years.   _______________________________ eSigned:  R. Roetta Sessions, MD FACP [facility] 12/11/2012 1:53 PM   CC:

## 2012-12-13 ENCOUNTER — Encounter (HOSPITAL_COMMUNITY): Payer: Self-pay | Admitting: Internal Medicine

## 2013-01-10 ENCOUNTER — Encounter (HOSPITAL_COMMUNITY): Payer: Self-pay | Admitting: *Deleted

## 2013-01-10 ENCOUNTER — Emergency Department (HOSPITAL_COMMUNITY): Payer: Medicare Other

## 2013-01-10 ENCOUNTER — Emergency Department (HOSPITAL_COMMUNITY)
Admission: EM | Admit: 2013-01-10 | Discharge: 2013-01-10 | Disposition: A | Discharge status: Home / Self Care | Payer: Medicare Other | Attending: Emergency Medicine | Admitting: Emergency Medicine

## 2013-01-10 DIAGNOSIS — J45909 Unspecified asthma, uncomplicated: Secondary | ICD-10-CM | POA: Diagnosis not present

## 2013-01-10 DIAGNOSIS — Z860100 Personal history of colon polyps, unspecified: Secondary | ICD-10-CM | POA: Insufficient documentation

## 2013-01-10 DIAGNOSIS — F17200 Nicotine dependence, unspecified, uncomplicated: Secondary | ICD-10-CM | POA: Diagnosis not present

## 2013-01-10 DIAGNOSIS — R109 Unspecified abdominal pain: Secondary | ICD-10-CM

## 2013-01-10 DIAGNOSIS — Z8719 Personal history of other diseases of the digestive system: Secondary | ICD-10-CM | POA: Diagnosis not present

## 2013-01-10 DIAGNOSIS — Z8659 Personal history of other mental and behavioral disorders: Secondary | ICD-10-CM | POA: Insufficient documentation

## 2013-01-10 DIAGNOSIS — R1084 Generalized abdominal pain: Secondary | ICD-10-CM | POA: Diagnosis not present

## 2013-01-10 DIAGNOSIS — R112 Nausea with vomiting, unspecified: Secondary | ICD-10-CM | POA: Diagnosis not present

## 2013-01-10 DIAGNOSIS — I10 Essential (primary) hypertension: Secondary | ICD-10-CM | POA: Insufficient documentation

## 2013-01-10 LAB — COMPREHENSIVE METABOLIC PANEL
ALT: 39 U/L (ref 0–53)
Albumin: 4.1 g/dL (ref 3.5–5.2)
BUN: 11 mg/dL (ref 6–23)
Chloride: 100 mEq/L (ref 96–112)
GFR calc Af Amer: >90 mL/min (ref >90)
GFR calc non Af Amer: >90 mL/min (ref >90)
Potassium: 3.8 mEq/L (ref 3.5–5.1)
Sodium: 138 mEq/L (ref 135–145)
Total Protein: 7.7 g/dL (ref 6.0–8.3)

## 2013-01-10 LAB — CBC WITH DIFFERENTIAL/PLATELET
Basophils Relative: 0 % (ref 0–1)
Hemoglobin: 14.3 g/dL (ref 13.0–17.0)
Lymphs Abs: 1 10*3/uL (ref 0.7–4.0)
MCHC: 33.9 g/dL (ref 30.0–36.0)
Monocytes Relative: 7 % (ref 3–12)
Neutro Abs: 6 10*3/uL (ref 1.7–7.7)
Neutrophils Relative %: 79 % — ABNORMAL HIGH (ref 43–77)
Platelets: 168 10*3/uL (ref 150–400)
RBC: 4.97 MIL/uL (ref 4.22–5.81)

## 2013-01-10 LAB — URINALYSIS, ROUTINE W REFLEX MICROSCOPIC
Leukocytes, UA: NEGATIVE
Nitrite: NEGATIVE
Specific Gravity, Urine: 1.025 (ref 1.005–1.030)
Urobilinogen, UA: 0.2 mg/dL (ref 0.0–1.0)

## 2013-01-10 LAB — LIPASE, BLOOD: Lipase: 23 U/L (ref 11–59)

## 2013-01-10 MED ORDER — METOCLOPRAMIDE HCL 5 MG/ML IJ SOLN
INTRAMUSCULAR | Status: AC
  Administered 2013-01-10: 10 mg via INTRAVENOUS
  Filled 2013-01-10: qty 2

## 2013-01-10 MED ORDER — FENTANYL CITRATE 0.05 MG/ML IJ SOLN: 50.0000 ug | Freq: Once | INTRAMUSCULAR | Status: AC

## 2013-01-10 MED ORDER — ONDANSETRON 8 MG PO TBDP: ORAL_TABLET | ORAL | Status: DC

## 2013-01-10 MED ORDER — ONDANSETRON HCL 4 MG/2ML IJ SOLN
INTRAMUSCULAR | Status: AC
  Administered 2013-01-10: 4 mg via INTRAVENOUS
  Filled 2013-01-10: qty 2

## 2013-01-10 MED ORDER — HYDROMORPHONE HCL PF 1 MG/ML IJ SOLN
1.0000 mg | Freq: Once | INTRAMUSCULAR | Status: AC
  Administered 2013-01-10: 1 mg via INTRAVENOUS
  Filled 2013-01-10: qty 1

## 2013-01-10 MED ORDER — SODIUM CHLORIDE 0.9 % IV BOLUS (SEPSIS)
1000.0000 mL | Freq: Once | INTRAVENOUS | Status: AC
  Administered 2013-01-10: 1000 mL via INTRAVENOUS

## 2013-01-10 MED ORDER — ONDANSETRON HCL 4 MG/2ML IJ SOLN: 4.0000 mg | Freq: Once | INTRAMUSCULAR | Status: AC

## 2013-01-10 MED ORDER — FENTANYL CITRATE 0.05 MG/ML IJ SOLN
INTRAMUSCULAR | Status: AC
  Administered 2013-01-10: 50 ug via INTRAVENOUS
  Filled 2013-01-10: qty 2

## 2013-01-10 MED ORDER — METOCLOPRAMIDE HCL 5 MG/ML IJ SOLN: 10.0000 mg | Freq: Once | INTRAMUSCULAR | Status: AC

## 2013-01-10 MED ORDER — METOCLOPRAMIDE HCL 10 MG PO TABS
ORAL_TABLET | ORAL | Status: AC
  Filled 2013-01-10: qty 2

## 2013-01-10 MED ORDER — OXYCODONE-ACETAMINOPHEN 5-325 MG PO TABS: 2.0000 | ORAL_TABLET | ORAL | Status: DC | PRN

## 2013-01-10 MED ORDER — METOCLOPRAMIDE HCL 10 MG PO TABS: 10.0000 mg | ORAL_TABLET | Freq: Four times a day (QID) | ORAL | Status: DC | PRN

## 2013-01-10 NOTE — ED Notes (Signed)
Sudden onset lower abdominal pain began after dinner tonight.  Pain is twisting, cramping pain 10/10/  Ate lunch early then had chips and frozen yogurt at home afterward.  No vomitting, some nausea.  Has taken MOM this week.

## 2013-01-10 NOTE — ED Notes (Signed)
Bednar, MD at bedside. 

## 2013-01-10 NOTE — ED Provider Notes (Signed)
History     CSN: [id number]  Arrival date & time 01/10/13  0035   First MD Initiated Contact with Patient 01/10/13 0244      Chief Complaint  Patient presents with  . Abdominal Pain    (Consider location/radiation/quality/duration/timing/severity/associated sxs/prior treatment) HPI 37 year old male has several hours of diffuse abdominal waxing and waning crampy pain with several episodes of nonbloody vomiting with normal bowel movement tonight with no chest pain cough or shortness of breath no testicular pain no dysuria no rash no trauma but he does have moderately severe diffuse abdominal pain with no treatment prior to arrival. Past Medical History  Diagnosis Date  . HTN (hypertension)   . Tourette disorder   . Tobacco abuse   . Colon polyp   . Obesity, morbid, BMI 50 or higher   . Asthma   . Ventral hernia     Past Surgical History  Procedure Laterality Date  . Colonoscopy  2011    [facility]: 9mm tubulovillous adenoma removed from rectum  . Abdominal hernia repair      as a baby  . Colonoscopy  2008    [facility]: ICV erythematous (bx lymphoid aggregate but no active inflammation or granulomas). TRV colon polyp 9mm (features of juvenile polyp, focal non-caseating granuloma)  . Colonoscopy N/A 12/11/2012    Procedure: COLONOSCOPY;  Surgeon: Corbin Ade, MD;  Location: [facility];  Service: Endoscopy;  Laterality: N/A;  12:30    Family History  Problem Relation Age of Onset  . Colon cancer Neg Hx   . Cancer Maternal Grandfather   . Liver disease Neg Hx     History  Substance Use Topics  . Smoking status: Current Every Day Smoker -- 0.50 packs/day    Types: Cigarettes  . Smokeless tobacco: Not on file  . Alcohol Use: No      Review of Systems 10 Systems reviewed and are negative for acute change except as noted in the HPI. Allergies  Benazepril  Home Medications   No current outpatient prescriptions on file.  BP 130/83  Pulse 79   Temp(Src) 98.1 F (36.7 C) (Oral)  Resp 20  Ht 6\' 1"  (1.854 m)  Wt 385 lb (174.635 kg)  BMI 50.81 kg/m2  SpO2 93%  Physical Exam  Nursing note and vitals reviewed. Constitutional:  Awake, alert, nontoxic appearance.  HENT:  Head: Atraumatic.  Eyes: Right eye exhibits no discharge. Left eye exhibits no discharge.  Neck: Neck supple.  Cardiovascular: Normal rate and regular rhythm.   No murmur heard. Pulmonary/Chest: Effort normal and breath sounds normal. No respiratory distress. He has no wheezes. He has no rales. He exhibits no tenderness.  Abdominal: Soft. Bowel sounds are normal. He exhibits no distension and no mass. There is tenderness. There is no rebound and no guarding.  Diffuse moderate tenderness with no rebound  Genitourinary:  Testicles nontender and no palpable inguinal hernias  Musculoskeletal: He exhibits no tenderness.  Baseline ROM, no obvious new focal weakness.  Neurological: He is alert.  Mental status and motor strength appears baseline for patient and situation.  Skin: No rash noted.  Psychiatric: He has a normal mood and affect.    ED Course  Procedures (including critical care time) Patient / Family / Caregiver understand and agree with initial ED impression and plan with expectations set for ED visit.  Pt stable in ED with no significant deterioration in condition.Do not feel CT/US imaging mandated initial ED visit. Labs Reviewed  CBC WITH  DIFFERENTIAL - Abnormal; Notable for the following:    Neutrophils Relative % 79 (*)    All other components within normal limits  COMPREHENSIVE METABOLIC PANEL - Abnormal; Notable for the following:    Glucose, Bld 106 (*)    Alkaline Phosphatase 129 (*)    All other components within normal limits  URINALYSIS, ROUTINE W REFLEX MICROSCOPIC  LIPASE, BLOOD   Ct Abdomen Pelvis W Contrast  01/12/2013   *RADIOLOGY REPORT*  Clinical Data: Abdominal pain, with intermittent nausea and vomiting.  CT ABDOMEN AND PELVIS  WITH CONTRAST  Technique:  Multidetector CT imaging of the abdomen and pelvis was performed following the standard protocol during bolus administration of intravenous contrast.  Contrast: OMNIPAQUE IOHEXOL 300 MG/ML  SOLN  Comparison: CT of the abdomen and pelvis performed 05/10/2009  Findings: Bibasilar linear airspace opacities likely reflect atelectasis.  There appears to be mild soft tissue inflammation about the gallbladder, with question of trace pericholecystic fluid, raising question for mild cholecystitis.  No definite stones are characterized on CT.  A small 0.8 cm hypodensity at the right hepatic lobe is only mildly increased in size from 2010, and is likely benign.  The liver is otherwise unremarkable in appearance.  The spleen is within normal limits.  The pancreas and adrenal glands are unremarkable.  The kidneys are unremarkable in appearance.  There is no evidence of hydronephrosis.  No renal or ureteral stones are seen.  No perinephric stranding is appreciated.  No free fluid is identified.  The small bowel is unremarkable in appearance.  The stomach is within normal limits.  No acute vascular abnormalities are seen.  A moderate-to-large anterior abdominal wall hernia is noted, containing only fat.  There also appears to be a tiny anterior abdominal wall hernia at the expected location of the umbilicus, containing only fat.  The appendix is normal in caliber, without evidence for appendicitis.  A few small diverticula are noted at the distal descending colon.  The colon is otherwise unremarkable in appearance.  The bladder is mildly distended and grossly unremarkable in appearance.  The prostate is borderline normal in size.  No inguinal lymphadenopathy is seen.  No acute osseous abnormalities are identified.  IMPRESSION:  1.  Mild soft tissue inflammation about the gallbladder, with question of trace pericholecystic fluid, raising concern for mild cholecystitis.  No definite stones  characterized on CT. 2.  Moderate to large anterior abdominal wall hernia, containing only fat, without evidence of soft tissue inflammation. 3.  Apparent tiny anterior abdominal hernia at the expected location of the umbilicus, also containing only fat. 4.  Bibasilar linear airspace opacities likely reflect atelectasis.   Original Report Authenticated By: Tonia Ghent, M.D.     1. Abdominal pain       MDM  I doubt any other [facility] precluding discharge at this time including, but not necessarily limited to the following:SBI, peritonitis. Feel recheck 8-12 hours reasonable and Pt agrees.        Hurman Horn, MD 01/12/13 [phone]

## 2013-01-10 NOTE — ED Notes (Signed)
Restless attempting to find position of comfort.

## 2013-01-10 NOTE — ED Notes (Signed)
Patient with no complaints at this time. Respirations even and unlabored. Skin warm/dry. Discharge instructions reviewed with patient at this time. Patient given opportunity to voice concerns/ask questions. Patient discharged at this time and left Emergency Department with steady gait.   

## 2013-01-10 NOTE — ED Notes (Signed)
Patient's wife called to update and to come pick patient up.

## 2013-01-11 ENCOUNTER — Encounter (HOSPITAL_COMMUNITY): Payer: Self-pay | Admitting: *Deleted

## 2013-01-11 ENCOUNTER — Emergency Department (HOSPITAL_COMMUNITY)
Admission: EM | Admit: 2013-01-11 | Discharge: 2013-01-11 | Discharge status: Against Medical Advice | Payer: Medicare Other | Attending: Emergency Medicine | Admitting: Emergency Medicine

## 2013-01-11 DIAGNOSIS — R509 Fever, unspecified: Secondary | ICD-10-CM | POA: Diagnosis not present

## 2013-01-11 DIAGNOSIS — I10 Essential (primary) hypertension: Secondary | ICD-10-CM | POA: Diagnosis not present

## 2013-01-11 DIAGNOSIS — R109 Unspecified abdominal pain: Secondary | ICD-10-CM | POA: Insufficient documentation

## 2013-01-11 DIAGNOSIS — F17200 Nicotine dependence, unspecified, uncomplicated: Secondary | ICD-10-CM | POA: Diagnosis not present

## 2013-01-11 DIAGNOSIS — R112 Nausea with vomiting, unspecified: Secondary | ICD-10-CM | POA: Diagnosis not present

## 2013-01-11 DIAGNOSIS — J45909 Unspecified asthma, uncomplicated: Secondary | ICD-10-CM | POA: Insufficient documentation

## 2013-01-11 NOTE — ED Notes (Signed)
No answer from waiting room when called to go back to room.  Pt left without notifying staff.

## 2013-01-11 NOTE — ED Notes (Signed)
abd pain with intermittent n/v since yesterday.  Seen here for same yesterday.  Given rx for percocet, reglan, and zofran.  Pt states is out of percocet.  rx for zofran has not been filled.  Pt states pain is not getting better.  Denies n/v at this time.  Also c/o fever.  Fever at 1330 of 101, tylenol given at that time.

## 2013-01-12 ENCOUNTER — Observation Stay (HOSPITAL_COMMUNITY)
Admission: EM | Admit: 2013-01-12 | Discharge: 2013-01-13 | Disposition: A | Discharge status: Home / Self Care | Payer: Medicare Other | Attending: General Surgery | Admitting: General Surgery

## 2013-01-12 ENCOUNTER — Encounter (HOSPITAL_COMMUNITY): Payer: Self-pay | Admitting: Emergency Medicine

## 2013-01-12 ENCOUNTER — Emergency Department (HOSPITAL_COMMUNITY): Payer: Medicare Other

## 2013-01-12 DIAGNOSIS — K810 Acute cholecystitis: Secondary | ICD-10-CM | POA: Diagnosis not present

## 2013-01-12 DIAGNOSIS — R109 Unspecified abdominal pain: Secondary | ICD-10-CM | POA: Diagnosis not present

## 2013-01-12 DIAGNOSIS — I10 Essential (primary) hypertension: Secondary | ICD-10-CM | POA: Diagnosis not present

## 2013-01-12 DIAGNOSIS — E876 Hypokalemia: Secondary | ICD-10-CM | POA: Insufficient documentation

## 2013-01-12 DIAGNOSIS — R112 Nausea with vomiting, unspecified: Secondary | ICD-10-CM | POA: Diagnosis not present

## 2013-01-12 DIAGNOSIS — K819 Cholecystitis, unspecified: Secondary | ICD-10-CM

## 2013-01-12 DIAGNOSIS — J45909 Unspecified asthma, uncomplicated: Secondary | ICD-10-CM | POA: Insufficient documentation

## 2013-01-12 DIAGNOSIS — K8020 Calculus of gallbladder without cholecystitis without obstruction: Secondary | ICD-10-CM | POA: Diagnosis not present

## 2013-01-12 LAB — COMPREHENSIVE METABOLIC PANEL
Albumin: 3.5 g/dL (ref 3.5–5.2)
BUN: 8 mg/dL (ref 6–23)
Chloride: 95 mEq/L — ABNORMAL LOW (ref 96–112)
Creatinine, Ser: 1.02 mg/dL (ref 0.50–1.35)
GFR calc Af Amer: >90 mL/min (ref >90)
Total Bilirubin: 1.4 mg/dL — ABNORMAL HIGH (ref 0.3–1.2)
Total Protein: 7.6 g/dL (ref 6.0–8.3)

## 2013-01-12 LAB — CBC WITH DIFFERENTIAL/PLATELET
Basophils Absolute: 0 10*3/uL (ref 0.0–0.1)
Basophils Relative: 0 % (ref 0–1)
Eosinophils Relative: 2 % (ref 0–5)
HCT: 41.4 % (ref 39.0–52.0)
MCHC: 33.8 g/dL (ref 30.0–36.0)
MCV: 83.5 fL (ref 78.0–100.0)
Monocytes Absolute: 0.8 10*3/uL (ref 0.1–1.0)
RDW: 13.2 % (ref 11.5–15.5)

## 2013-01-12 MED ORDER — HYDROMORPHONE HCL PF 1 MG/ML IJ SOLN
1.0000 mg | INTRAMUSCULAR | Status: DC | PRN
  Administered 2013-01-12 – 2013-01-13 (×3): 1 mg via INTRAVENOUS
  Filled 2013-01-12 (×3): qty 1

## 2013-01-12 MED ORDER — HYDROCHLOROTHIAZIDE 25 MG PO TABS
25.0000 mg | ORAL_TABLET | Freq: Every day | ORAL | Status: DC
  Administered 2013-01-12 – 2013-01-13 (×2): 25 mg via ORAL
  Filled 2013-01-12 (×2): qty 1

## 2013-01-12 MED ORDER — LACTATED RINGERS IV SOLN
INTRAVENOUS | Status: DC
  Administered 2013-01-12 – 2013-01-13 (×2): via INTRAVENOUS

## 2013-01-12 MED ORDER — ONDANSETRON HCL 4 MG/2ML IJ SOLN
4.0000 mg | Freq: Once | INTRAMUSCULAR | Status: AC
  Administered 2013-01-12: 4 mg via INTRAVENOUS
  Filled 2013-01-12: qty 2

## 2013-01-12 MED ORDER — ACETAMINOPHEN 325 MG PO TABS
650.0000 mg | ORAL_TABLET | Freq: Once | ORAL | Status: AC
  Administered 2013-01-12: 650 mg via ORAL
  Filled 2013-01-12: qty 2

## 2013-01-12 MED ORDER — PIPERACILLIN-TAZOBACTAM 3.375 G IVPB
INTRAVENOUS | Status: AC
  Filled 2013-01-12: qty 50

## 2013-01-12 MED ORDER — LACTATED RINGERS IV SOLN
INTRAVENOUS | Status: DC
  Administered 2013-01-12: 11:00:00 via INTRAVENOUS

## 2013-01-12 MED ORDER — PIPERACILLIN-TAZOBACTAM 3.375 G IVPB 30 MIN
3.3750 g | Freq: Once | INTRAVENOUS | Status: AC
  Administered 2013-01-12: 3.375 g via INTRAVENOUS
  Filled 2013-01-12: qty 50

## 2013-01-12 MED ORDER — ONDANSETRON HCL 4 MG/2ML IJ SOLN: 4.0000 mg | Freq: Four times a day (QID) | INTRAMUSCULAR | Status: DC | PRN

## 2013-01-12 MED ORDER — PANTOPRAZOLE SODIUM 40 MG IV SOLR
40.0000 mg | Freq: Every day | INTRAVENOUS | Status: DC
  Administered 2013-01-12: 40 mg via INTRAVENOUS
  Filled 2013-01-12: qty 40

## 2013-01-12 MED ORDER — MOMETASONE FURO-FORMOTEROL FUM 100-5 MCG/ACT IN AERO
2.0000 | INHALATION_SPRAY | Freq: Two times a day (BID) | RESPIRATORY_TRACT | Status: DC
  Administered 2013-01-12 – 2013-01-13 (×2): 2 via RESPIRATORY_TRACT
  Filled 2013-01-12: qty 8.8

## 2013-01-12 MED ORDER — POTASSIUM CHLORIDE 10 MEQ/100ML IV SOLN
10.0000 meq | Freq: Once | INTRAVENOUS | Status: AC
  Administered 2013-01-12: 10 meq via INTRAVENOUS
  Filled 2013-01-12: qty 100

## 2013-01-12 MED ORDER — IOHEXOL 300 MG/ML  SOLN: 50.0000 mL | Freq: Once | INTRAMUSCULAR | Status: AC | PRN

## 2013-01-12 MED ORDER — HYDROMORPHONE HCL PF 1 MG/ML IJ SOLN
1.0000 mg | Freq: Once | INTRAMUSCULAR | Status: AC
  Administered 2013-01-12: 1 mg via INTRAVENOUS
  Filled 2013-01-12: qty 1

## 2013-01-12 MED ORDER — ENOXAPARIN SODIUM 40 MG/0.4ML ~~LOC~~ SOLN
40.0000 mg | Freq: Every day | SUBCUTANEOUS | Status: DC
  Administered 2013-01-12: 40 mg via SUBCUTANEOUS
  Filled 2013-01-12 (×2): qty 0.4

## 2013-01-12 MED ORDER — AMLODIPINE BESYLATE 5 MG PO TABS
10.0000 mg | ORAL_TABLET | Freq: Every day | ORAL | Status: DC
  Administered 2013-01-12 – 2013-01-13 (×2): 10 mg via ORAL
  Filled 2013-01-12 (×2): qty 2

## 2013-01-12 MED ORDER — RISPERIDONE 1 MG PO TABS
2.0000 mg | ORAL_TABLET | Freq: Three times a day (TID) | ORAL | Status: DC
  Administered 2013-01-12 – 2013-01-13 (×3): 2 mg via ORAL
  Filled 2013-01-12 (×3): qty 2

## 2013-01-12 MED ORDER — IOHEXOL 300 MG/ML  SOLN
100.0000 mL | Freq: Once | INTRAMUSCULAR | Status: AC | PRN
  Administered 2013-01-12: 100 mL via INTRAVENOUS

## 2013-01-12 NOTE — ED Provider Notes (Signed)
History     CSN: [id number]  Arrival date & time 01/12/13  0216   First MD Initiated Contact with Patient 01/12/13 0321      Chief Complaint  Patient presents with  . Abdominal Pain     Patient is a 37 y.o. male presenting with abdominal pain. The history is provided by the patient.  Abdominal Pain This is a recurrent problem. The current episode started more than 2 days ago. The problem occurs daily. The problem has been gradually worsening. Associated symptoms include abdominal pain. Pertinent negatives include no chest pain and no shortness of breath. Exacerbated by: palpation. Nothing relieves the symptoms. Treatments tried: pain meds. The treatment provided mild relief.   Pt reports diffuse abdominal pain that started over 3 days ago It is worsening He reports nausea No vomiting/diarrhea reported No cp is reported No dysuria is reported He reports fever up to 100 at home Past Medical History  Diagnosis Date  . HTN (hypertension)   . Tourette disorder   . Tobacco abuse   . Colon polyp   . Obesity, morbid, BMI 50 or higher   . Asthma   . Ventral hernia     Past Surgical History  Procedure Laterality Date  . Colonoscopy  2011    [facility]: 9mm tubulovillous adenoma removed from rectum  . Abdominal hernia repair      as a baby  . Colonoscopy  2008    [facility]: ICV erythematous (bx lymphoid aggregate but no active inflammation or granulomas). TRV colon polyp 9mm (features of juvenile polyp, focal non-caseating granuloma)  . Colonoscopy N/A 12/11/2012    Procedure: COLONOSCOPY;  Surgeon: Corbin Ade, MD;  Location: [facility];  Service: Endoscopy;  Laterality: N/A;  12:30    Family History  Problem Relation Age of Onset  . Colon cancer Neg Hx   . Cancer Maternal Grandfather   . Liver disease Neg Hx     History  Substance Use Topics  . Smoking status: Current Every Day Smoker -- 0.50 packs/day    Types: Cigarettes  . Smokeless tobacco: Not  on file  . Alcohol Use: No      Review of Systems  Constitutional: Positive for fever.  Respiratory: Negative for shortness of breath.   Cardiovascular: Negative for chest pain.  Gastrointestinal: Positive for nausea and abdominal pain.  Genitourinary: Negative for dysuria and testicular pain.  Neurological: Negative for weakness.  All other systems reviewed and are negative.    Allergies  Benazepril  Home Medications   Current Outpatient Rx  Name  Route  Sig  Dispense  Refill  . ADVAIR DISKUS 250-50 MCG/DOSE AEPB   Inhalation   Inhale 1 puff into the lungs 2 (two) times daily.          Marland Kitchen amLODipine (NORVASC) 10 MG tablet   Oral   Take 10 mg by mouth daily.          . hydrochlorothiazide (HYDRODIURIL) 25 MG tablet   Oral   Take 25 mg by mouth daily.          . metoCLOPramide (REGLAN) 10 MG tablet   Oral   Take 1 tablet (10 mg total) by mouth every 6 (six) hours as needed (nausea/headache). Dispense to go   2 tablet   0   . ondansetron (ZOFRAN ODT) 8 MG disintegrating tablet      8mg  ODT q4 hours prn nausea   2 tablet   0  Dispense to go   . oxyCODONE-acetaminophen (PERCOCET) 5-325 MG per tablet   Oral   Take 2 tablets by mouth every 4 (four) hours as needed for pain. Dispense to go   6 tablet   0   . polyethylene glycol-electrolytes (TRILYTE) 420 G solution   Oral   Take 4,000 mLs by mouth as directed.   4000 mL   0   . potassium chloride SA (K-DUR,KLOR-CON) 20 MEQ tablet   Oral   Take 20 mEq by mouth daily.          . risperiDONE (RISPERDAL) 2 MG tablet   Oral   Take 2 mg by mouth 3 (three) times daily.            BP 133/63  Pulse 114  Temp(Src) 100.6 F (38.1 C) (Oral)  Resp 16  Ht 6\' 1"  (1.854 m)  Wt 385 lb (174.635 kg)  BMI 50.81 kg/m2  SpO2 93%  Physical Exam CONSTITUTIONAL: Well developed/well nourished HEAD: Normocephalic/atraumatic EYES: EOMI/PERRL ENMT: Mucous membranes moist NECK: supple no meningeal  signs SPINE:entire spine nontender CV: S1/S2 noted, no murmurs/rubs/gallops noted LUNGS: Lungs are clear to auscultation bilaterally, no apparent distress ABDOMEN: soft, moderate tenderness mostly over mid abominal scar.  Firm area noted that is tender to palpation, no rebound or guarding. He is obese GU:no cva tenderness. No inguinal hernia and no testicular tenderness NEURO: Pt is awake/alert, moves all extremitiesx4 EXTREMITIES: pulses normal, full ROM SKIN: warm, color normal PSYCH: no abnormalities of mood noted  ED Course  Procedures   Labs Reviewed  CBC WITH DIFFERENTIAL  pt here with continued abdominal pain with specific tenderness over mid abdominal scar His exam is limited due to obesity , but given continued pain and fever, will obtain CT imaging  5:29 AM Will repeat CMP/lipase and likely call surgery after CT results noted 6:15 AM Ct scan shows cholecystitis Spoke to dr zieglar, given fever with abnormal LFTs will admit He requests zosyn Pt currently stable MDM  Nursing notes including past medical history and social history reviewed and considered in documentation Labs/vital reviewed and considered Previous records reviewed and considered         Joya Gaskins, MD 01/12/13 [phone]

## 2013-01-12 NOTE — H&P (Signed)
Jimmy Miller is an 37 y.o. male.   Chief Complaint: Abdominal pain. HPI: Patient had initially presented to [facility] ED several nights ago with epigastric/RUQ pain.  Was given pain medication and went home.  Pain actually improved somewhat and then again increased.  Returned to ED this AM.  Pain now somewhat better.   Nausea controlled.  NO jaundice.  No fever or chills currently.  Some family history of biliary disease.    Past Medical History  Diagnosis Date  . HTN (hypertension)   . Tourette disorder   . Tobacco abuse   . Colon polyp   . Obesity, morbid, BMI 50 or higher   . Asthma   . Ventral hernia     Past Surgical History  Procedure Laterality Date  . Colonoscopy  2011    [facility]: 9mm tubulovillous adenoma removed from rectum  . Abdominal hernia repair      as a baby  . Colonoscopy  2008     [facility]: ICV erythematous (bx lymphoid aggregate but no active inflammation or granulomas). TRV colon polyp 9mm (features of juvenile polyp, focal non-caseating granuloma)  . Colonoscopy N/A 12/11/2012    Procedure: COLONOSCOPY;  Surgeon: Corbin Ade, MD;  Location: [facility];  Service: Endoscopy;  Laterality: N/A;  12:30    Family History  Problem Relation Age of Onset  . Colon cancer Neg Hx   . Cancer Maternal Grandfather   . Liver disease Neg Hx    Social History:  reports that he has been smoking Cigarettes.  He has been smoking about 0.50 packs per day. He does not have any smokeless tobacco history on file. He reports that he does not drink alcohol or use illicit drugs.  Allergies:  Allergies  Allergen Reactions  . Benazepril Swelling    Mouth swelling    Medications Prior to Admission  Medication Sig Dispense Refill  . oxyCODONE-acetaminophen (PERCOCET) 5-325 MG per tablet Take 2 tablets by mouth every 4 (four) hours as needed for pain. Dispense to go  6 tablet  0  . ADVAIR DISKUS 250-50 MCG/DOSE AEPB Inhale 1 puff into the lungs 2 (two) times  daily.       Marland Kitchen amLODipine (NORVASC) 10 MG tablet Take 10 mg by mouth daily.       . hydrochlorothiazide (HYDRODIURIL) 25 MG tablet Take 25 mg by mouth daily.       . metoCLOPramide (REGLAN) 10 MG tablet Take 1 tablet (10 mg total) by mouth every 6 (six) hours as needed (nausea/headache). Dispense to go  2 tablet  0  . ondansetron (ZOFRAN ODT) 8 MG disintegrating tablet 8mg  ODT q4 hours prn nausea  2 tablet  0  . polyethylene glycol-electrolytes (TRILYTE) 420 G solution Take 4,000 mLs by mouth as directed.  4000 mL  0  . potassium chloride SA (K-DUR,KLOR-CON) 20 MEQ tablet Take 20 mEq by mouth daily.       . risperiDONE (RISPERDAL) 2 MG tablet Take 2 mg by mouth 3 (three) times daily.         Results for orders placed during the hospital encounter of 01/12/13 (from the past 48 hour(s))  CBC WITH DIFFERENTIAL     Status: None   Collection Time    01/12/13  3:59 AM      Result Value Range   WBC 8.1  4.0 - 10.5 K/uL   RBC 4.96  4.22 - 5.81 MIL/uL   Hemoglobin 14.0  13.0 - 17.0 g/dL  HCT 41.4  39.0 - 52.0 %   MCV 83.5  78.0 - 100.0 fL   MCH 28.2  26.0 - 34.0 pg   MCHC 33.8  30.0 - 36.0 g/dL   RDW 16.1  09.6 - 04.5 %   Platelets 176  150 - 400 K/uL   Neutrophils Relative % 72  43 - 77 %   Neutro Abs 5.8  1.7 - 7.7 K/uL   Lymphocytes Relative 16  12 - 46 %   Lymphs Abs 1.3  0.7 - 4.0 K/uL   Monocytes Relative 9  3 - 12 %   Monocytes Absolute 0.8  0.1 - 1.0 K/uL   Eosinophils Relative 2  0 - 5 %   Eosinophils Absolute 0.2  0.0 - 0.7 K/uL   Basophils Relative 0  0 - 1 %   Basophils Absolute 0.0  0.0 - 0.1 K/uL  LIPASE, BLOOD     Status: None   Collection Time    01/12/13  3:59 AM      Result Value Range   Lipase 17  11 - 59 U/L  COMPREHENSIVE METABOLIC PANEL     Status: Abnormal   Collection Time    01/12/13  3:59 AM      Result Value Range   Sodium 134 (*) 135 - 145 mEq/L   Potassium 2.8 (*) 3.5 - 5.1 mEq/L   Chloride 95 (*) 96 - 112 mEq/L   CO2 30  19 - 32 mEq/L   Glucose,  Bld 139 (*) 70 - 99 mg/dL   BUN 8  6 - 23 mg/dL   Creatinine, Ser 4.09  0.50 - 1.35 mg/dL   Calcium 8.9  8.4 - 81.1 mg/dL   Total Protein 7.6  6.0 - 8.3 g/dL   Albumin 3.5  3.5 - 5.2 g/dL   AST 914 (*) 0 - 37 U/L   ALT 106 (*) 0 - 53 U/L   Alkaline Phosphatase 138 (*) 39 - 117 U/L   Total Bilirubin 1.4 (*) 0.3 - 1.2 mg/dL   GFR calc non Af Amer >90  >90 mL/min   GFR calc Af Amer >90  >90 mL/min   Comment:            The eGFR has been calculated     using the CKD EPI equation.     This calculation has not been     validated in all clinical     situations.     eGFR's persistently     <90 mL/min signify     possible Chronic Kidney Disease.   Ct Abdomen Pelvis W Contrast  01/12/2013   *RADIOLOGY REPORT*  Clinical Data: Abdominal pain, with intermittent nausea and vomiting.  CT ABDOMEN AND PELVIS WITH CONTRAST  Technique:  Multidetector CT imaging of the abdomen and pelvis was performed following the standard protocol during bolus administration of intravenous contrast.  Contrast: OMNIPAQUE IOHEXOL 300 MG/ML  SOLN  Comparison: CT of the abdomen and pelvis performed 05/10/2009  Findings: Bibasilar linear airspace opacities likely reflect atelectasis.  There appears to be mild soft tissue inflammation about the gallbladder, with question of trace pericholecystic fluid, raising question for mild cholecystitis.  No definite stones are characterized on CT.  A small 0.8 cm hypodensity at the right hepatic lobe is only mildly increased in size from 2010, and is likely benign.  The liver is otherwise unremarkable in appearance.  The spleen is within normal limits.  The pancreas and adrenal glands are  unremarkable.  The kidneys are unremarkable in appearance.  There is no evidence of hydronephrosis.  No renal or ureteral stones are seen.  No perinephric stranding is appreciated.  No free fluid is identified.  The small bowel is unremarkable in appearance.  The stomach is within normal limits.  No acute  vascular abnormalities are seen.  A moderate-to-large anterior abdominal wall hernia is noted, containing only fat.  There also appears to be a tiny anterior abdominal wall hernia at the expected location of the umbilicus, containing only fat.  The appendix is normal in caliber, without evidence for appendicitis.  A few small diverticula are noted at the distal descending colon.  The colon is otherwise unremarkable in appearance.  The bladder is mildly distended and grossly unremarkable in appearance.  The prostate is borderline normal in size.  No inguinal lymphadenopathy is seen.  No acute osseous abnormalities are identified.  IMPRESSION:  1.  Mild soft tissue inflammation about the gallbladder, with question of trace pericholecystic fluid, raising concern for mild cholecystitis.  No definite stones characterized on CT. 2.  Moderate to large anterior abdominal wall hernia, containing only fat, without evidence of soft tissue inflammation. 3.  Apparent tiny anterior abdominal hernia at the expected location of the umbilicus, also containing only fat. 4.  Bibasilar linear airspace opacities likely reflect atelectasis.   Original Report Authenticated By: Tonia Ghent, M.D.    Review of Systems  Constitutional: Positive for fever and chills.  HENT: Negative.   Eyes: Negative.   Respiratory: Negative.   Cardiovascular: Negative.   Gastrointestinal: Positive for nausea, vomiting and abdominal pain (Epigastric). Negative for diarrhea, constipation, blood in stool and melena.  Genitourinary: Negative.   Musculoskeletal: Negative.   Skin: Negative.   Neurological: Negative.   Endo/Heme/Allergies: Negative.   Psychiatric/Behavioral: Negative.     Blood pressure 113/79, pulse 91, temperature 98.4 F (36.9 C), temperature source Oral, resp. rate 20, height 6\' 1"  (1.854 m), weight 175.2 kg (386 lb 3.9 oz), SpO2 94.00%. Physical Exam  Constitutional: He is oriented to person, place, and time. He appears  well-developed and well-nourished. No distress.  Morbidly obese.    HENT:  Head: Normocephalic and atraumatic.  Eyes: Conjunctivae and EOM are normal. Pupils are equal, round, and reactive to light. No scleral icterus.  Neck: Normal range of motion. Neck supple. No thyromegaly present.  Cardiovascular: Normal rate, regular rhythm and normal heart sounds.   Respiratory: Effort normal and breath sounds normal. No respiratory distress.  GI: Soft. Bowel sounds are normal. He exhibits no distension and no mass. There is tenderness (Mild RUQ tenderness). There is no rebound and no guarding.  Lymphadenopathy:    He has no cervical adenopathy.  Neurological: He is alert and oriented to person, place, and time.  Skin: Skin is warm and dry.     Assessment/Plan Acute cholecystitis.  Continue limited PO.  Will continue medications for terete's.  COntinue IV fluid.  ABX coverage with zosyn.  Options discussed with patient.  Patient will consider proceeding with operation on this admission versus out patient.    Evianna Chandran C 01/12/2013, 7:03 PM

## 2013-01-12 NOTE — ED Notes (Addendum)
Pt denies NV

## 2013-01-12 NOTE — ED Notes (Signed)
Pt was seen here Friday morning for the same. Came in yesterday afternoon but left [facility]. C/o abdominal pain since Thursday. States "x-rays were done but nothing was found" States his prescriptions for pain and nausea are working however once the medicine wears off the pain is right back.

## 2013-01-13 LAB — CBC
HCT: 38.8 % — ABNORMAL LOW (ref 39.0–52.0)
MCV: 85.1 fL (ref 78.0–100.0)
Platelets: 173 10*3/uL (ref 150–400)
RBC: 4.56 MIL/uL (ref 4.22–5.81)
WBC: 5.5 10*3/uL (ref 4.0–10.5)

## 2013-01-13 LAB — BASIC METABOLIC PANEL
CO2: 33 mEq/L — ABNORMAL HIGH (ref 19–32)
Chloride: 97 mEq/L (ref 96–112)
Sodium: 138 mEq/L (ref 135–145)

## 2013-01-13 MED ORDER — OXYCODONE-ACETAMINOPHEN 5-325 MG PO TABS: 1.0000 | ORAL_TABLET | ORAL | Status: DC | PRN

## 2013-01-13 NOTE — Progress Notes (Signed)
Pt discharged home today per Dr. Lovell Sheehan. Pt's IV site D/C'd and WNL. Pt's VS stable at this time. Pt provided with home medication list, discharge instructions and prescriptions. Verbalized understanding. Pt ambulated off floor with NT supervision and wife in stable condition.

## 2013-01-13 NOTE — Discharge Summary (Signed)
Physician Discharge Summary  Patient ID: Jimmy Miller MRN: [id number] DOB/AGE: 1975-08-13 36 y.o.  Admit date: 01/12/2013 Discharge date: 01/13/2013  Admission Diagnoses: Nausea and vomiting, biliary colic, hypokalemia  Discharge Diagnoses: Same Active Problems:   * No active hospital problems. *   Discharged Condition: good  Hospital Course: Patient is a morbidly obese 37 year old black male who presented to the emergency room on 01/12/2013 with worsening right upper quadrant abdominal pain. She been to the emergency room several days earlier with similar episode. CT scan the abdomen revealed mild pericholecystic fluid, consistent with biliary colic secondary to cholelithiasis. He was also noted to be hypokalemic with a potassium 2.8. Currently takes potassium supplements at home do to a diuretic that he is on for his hypertension. He was brought in to the hospital under observation status for further management treatment. His potassium is up to 3.0. His nausea and vomiting have resolved. He has no abdominal pain. The patient would like to hold and possibly have a laparoscopic cholecystectomy as an outpatient. He was told he needed followup with Dr. Sudie Bailey for management of his hypokalemia.  Discharge Exam: Blood pressure 114/76, pulse 91, temperature 98.9 F (37.2 C), temperature source Oral, resp. rate 20, height 6\' 1"  (1.854 m), weight 175.2 kg (386 lb 3.9 oz), SpO2 95.00%. General appearance: alert, cooperative and no distress Resp: clear to auscultation bilaterally Cardio: regular rate and rhythm, S1, S2 normal, no murmur, click, rub or gallop GI: soft, non-tender; bowel sounds normal; no masses,  no organomegaly  Disposition: 01-Home or Self Care     Medication List    TAKE these medications       ADVAIR DISKUS 250-50 MCG/DOSE Aepb  Generic drug:  Fluticasone-Salmeterol  Inhale 1 puff into the lungs 2 (two) times daily.     amLODipine 10 MG tablet  Commonly known as:   NORVASC  Take 10 mg by mouth daily.     hydrochlorothiazide 25 MG tablet  Commonly known as:  HYDRODIURIL  Take 25 mg by mouth daily.     metoCLOPramide 10 MG tablet  Commonly known as:  REGLAN  Take 1 tablet (10 mg total) by mouth every 6 (six) hours as needed (nausea/headache). Dispense to go     ondansetron 8 MG disintegrating tablet  Commonly known as:  ZOFRAN ODT  8mg  ODT q4 hours prn nausea     oxyCODONE-acetaminophen 5-325 MG per tablet  Commonly known as:  PERCOCET  Take 1-2 tablets by mouth every 4 (four) hours as needed for pain. Dispense to go     polyethylene glycol-electrolytes 420 G solution  Commonly known as:  TRILYTE  Take 4,000 mLs by mouth as directed.     potassium chloride SA 20 MEQ tablet  Commonly known as:  K-DUR,KLOR-CON  Take 20 mEq by mouth daily.     risperiDONE 2 MG tablet  Commonly known as:  RISPERDAL  Take 2 mg by mouth 3 (three) times daily.           Follow-up Information   Follow up with Dalia Heading, MD. (As needed)    Contact information:   [address] [place] [address] [phone]       Signed: Franky Macho A 01/13/2013, 10:35 AM

## 2013-01-13 NOTE — Progress Notes (Signed)
UR chart review completed.  

## 2013-01-20 MED FILL — Oxycodone w/ Acetaminophen Tab 5-325 MG: ORAL | Qty: 6 | Status: AC

## 2013-01-23 DIAGNOSIS — K8000 Calculus of gallbladder with acute cholecystitis without obstruction: Secondary | ICD-10-CM | POA: Diagnosis not present

## 2013-03-27 DIAGNOSIS — Z0289 Encounter for other administrative examinations: Secondary | ICD-10-CM | POA: Diagnosis not present

## 2013-03-27 DIAGNOSIS — Z23 Encounter for immunization: Secondary | ICD-10-CM | POA: Diagnosis not present

## 2013-06-26 IMAGING — CR DG ABDOMEN ACUTE W/ 1V CHEST
5 series · 5 of 5 positions shown · non-contrast
Comparison: Chest radiograph performed 09/20/2010, and CT of the
abdomen and pelvis performed 05/10/2009

CLINICAL DATA: Abdominal pain, nausea and vomiting.

ACUTE ABDOMEN SERIES (ABDOMEN 2 VIEW & CHEST 1 VIEW)

[view not recorded (1 of 5)]
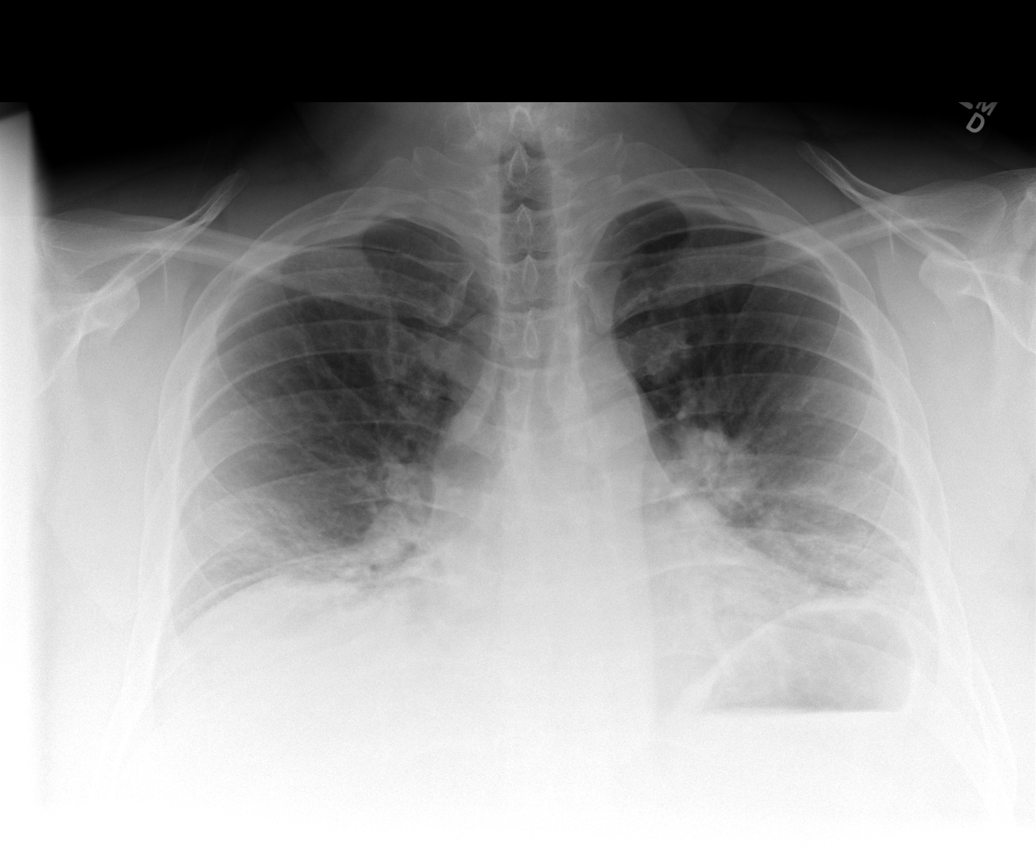

[view not recorded (2 of 5)]
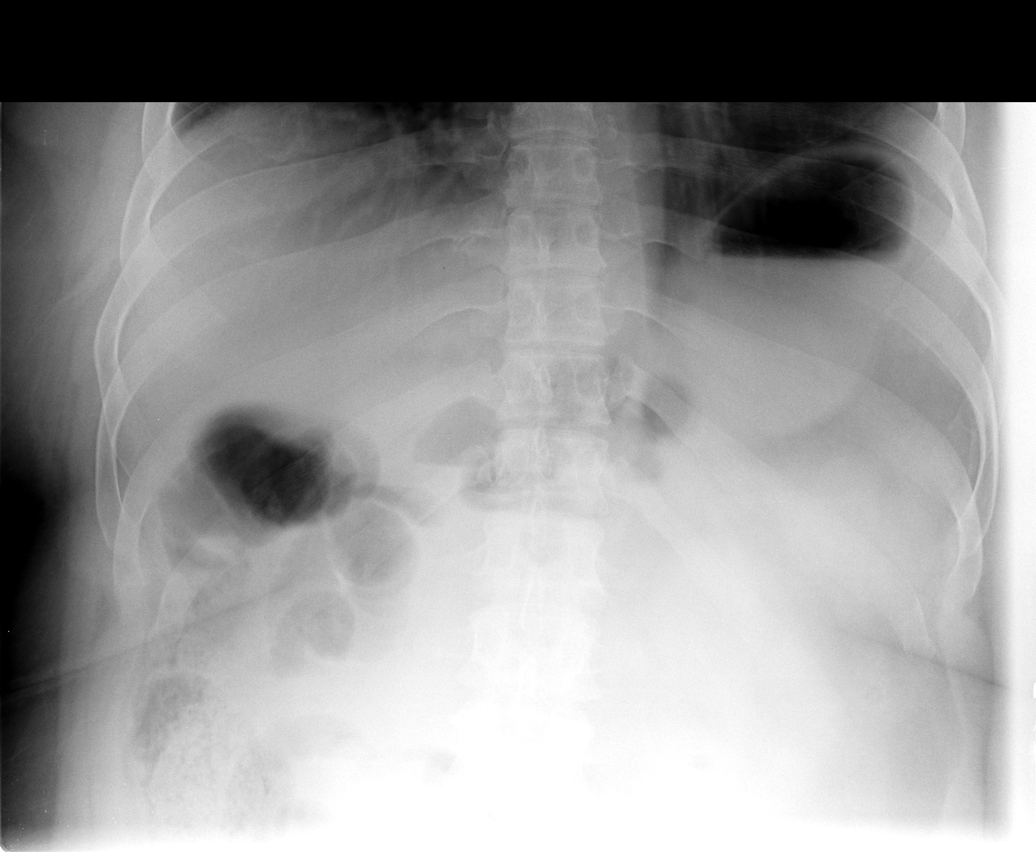

[view not recorded (3 of 5)]
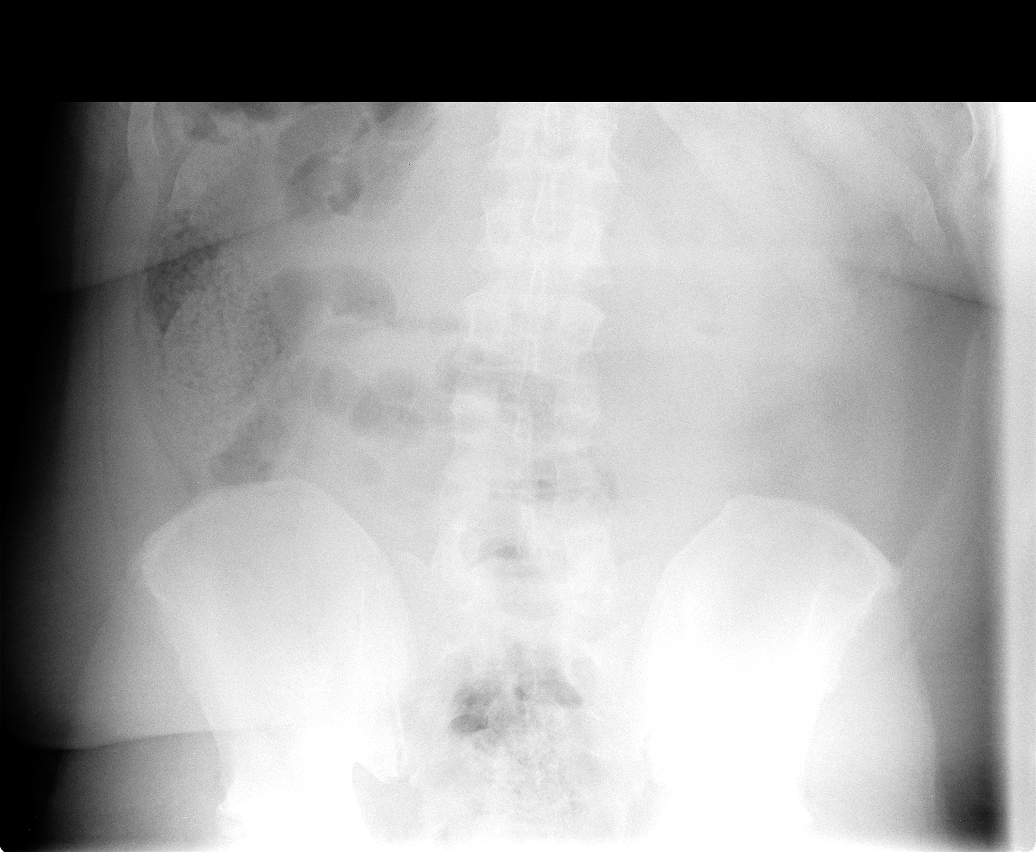

[view not recorded (4 of 5)]
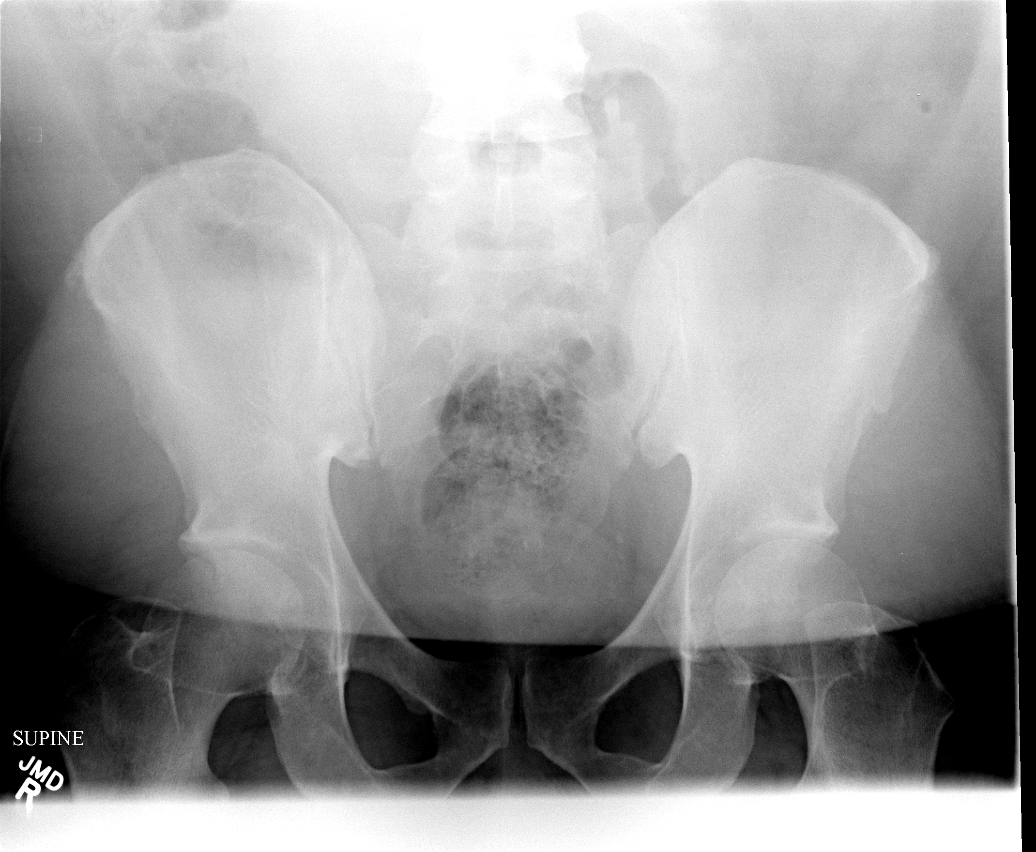

[view not recorded (5 of 5)]
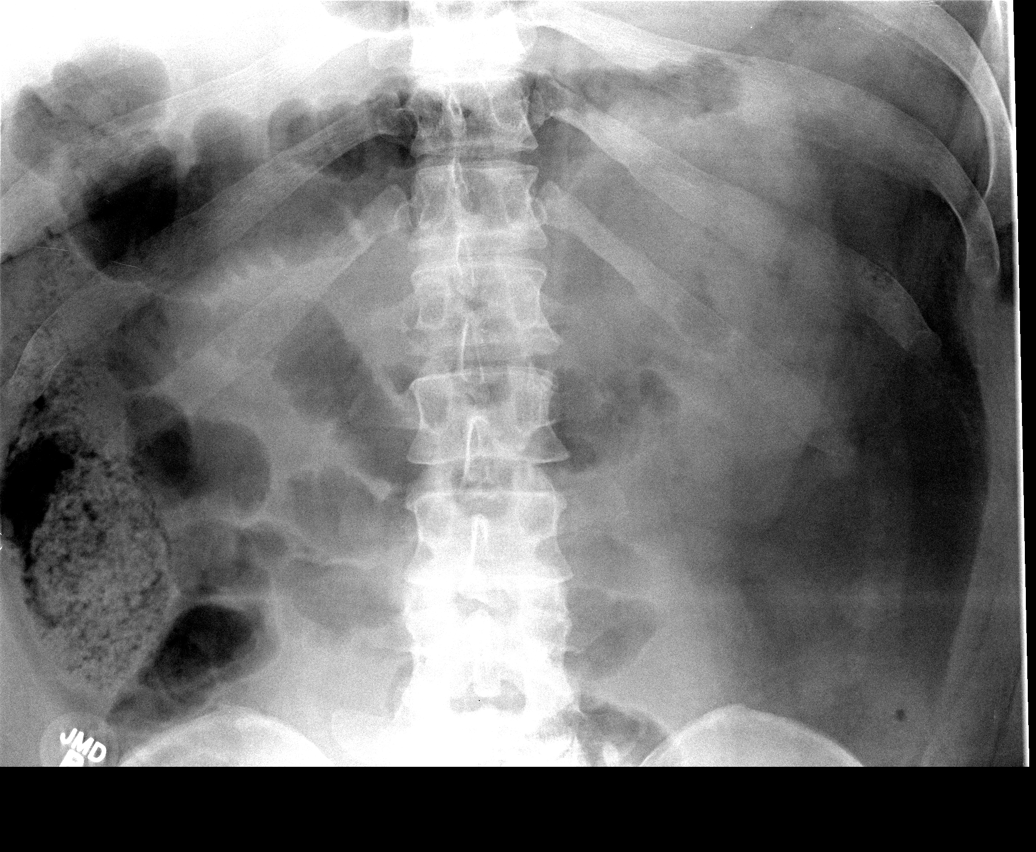

[5 of 5 positions shown; findings below may reference images not displayed]

FINDINGS: The lungs are hypoexpanded.  Bibasilar airspace
opacification may reflect multifocal pneumonia or possibly mild
interstitial edema.  Underlying vascular congestion is seen.  No
definite pleural effusion or pneumothorax is seen.  The
cardiomediastinal silhouette is borderline normal in size.

The visualized bowel gas pattern is unremarkable.  Scattered stool
and air are seen within the colon; there is no evidence of small
bowel dilatation to suggest obstruction.  No free intra-abdominal
air is identified on the provided upright view.

No acute osseous abnormalities are seen; the sacroiliac joints are
unremarkable in appearance.
IMPRESSION: 1.  Unremarkable bowel gas pattern; no free intra-abdominal air
seen.
2.  Lungs hypoexpanded; bibasilar airspace opacification may
reflect pneumonia or possibly mild interstitial edema, depending on
the patient's symptoms.  Underlying vascular congestion seen.

## 2013-08-04 ENCOUNTER — Encounter: Payer: Self-pay | Admitting: Family Medicine

## 2013-08-04 ENCOUNTER — Ambulatory Visit (INDEPENDENT_AMBULATORY_CARE_PROVIDER_SITE_OTHER): Payer: Medicare Other | Admitting: Family Medicine

## 2013-08-04 VITALS — BP 126/72 | HR 80 | Temp 98.2°F | Resp 20 | Ht 73.0 in | Wt 381.0 lb

## 2013-08-04 DIAGNOSIS — Z23 Encounter for immunization: Secondary | ICD-10-CM | POA: Diagnosis not present

## 2013-08-04 DIAGNOSIS — J45909 Unspecified asthma, uncomplicated: Secondary | ICD-10-CM | POA: Diagnosis not present

## 2013-08-04 DIAGNOSIS — R5381 Other malaise: Secondary | ICD-10-CM

## 2013-08-04 DIAGNOSIS — I10 Essential (primary) hypertension: Secondary | ICD-10-CM

## 2013-08-04 DIAGNOSIS — F952 Tourette's disorder: Secondary | ICD-10-CM | POA: Diagnosis not present

## 2013-08-04 MED ORDER — AMLODIPINE BESYLATE 10 MG PO TABS: 10.0000 mg | ORAL_TABLET | Freq: Every day | ORAL | Status: DC

## 2013-08-04 NOTE — Progress Notes (Signed)
Subjective:    Patient ID: Jimmy Miller, male    DOB: 06-Aug-1976, 37 y.o.   MRN: [id number]  HPI  Patient is a very pleasant 37 year old African American male who presents today to establish care. He has a past medical history of hypertension, asthma, Tourette's disorder, and abnormal LFTs. Patient reportedly had a sleep study performed in 2007 that was normal. Head colonoscopy performed this year that was negative. His next colonoscopy is due in 5 years. This is due to a history of colon polyps. His blood pressures well controlled at 126/72. He denies any chest pain shortness of breath or dyspnea on exertion. His asthma is well controlled on Advair. He seldom if ever acquired a rescue inhaler. He has not had his flu shot yet. He is also taking wrist and all 4 hysteretic syndrome. He's never been on any medication other than Risperdal. His past medical history is complicated by morbid obesity. Past Medical History  Diagnosis Date  . HTN (hypertension)   . Tourette disorder   . Tobacco abuse   . Colon polyp   . Obesity, morbid, BMI 50 or higher   . Asthma   . Ventral hernia    Current Outpatient Prescriptions on File Prior to Visit  Medication Sig Dispense Refill  . ADVAIR DISKUS 250-50 MCG/DOSE AEPB Inhale 1 puff into the lungs 2 (two) times daily.       . hydrochlorothiazide (HYDRODIURIL) 25 MG tablet Take 25 mg by mouth daily.       . potassium chloride SA (K-DUR,KLOR-CON) 20 MEQ tablet Take 20 mEq by mouth daily.       . risperiDONE (RISPERDAL) 2 MG tablet Take 2 mg by mouth 3 (three) times daily.        No current facility-administered medications on file prior to visit.   Allergies  Allergen Reactions  . Benazepril Swelling    Mouth swelling   History   Social History  . Marital Status: Legally Separated    Spouse Name: N/A    Number of Children: 0  . Years of Education: N/A   Occupational History  . disability    Social History Main Topics  . Smoking status:  Former Smoker -- 0.50 packs/day    Types: Cigarettes  . Smokeless tobacco: Former Neurosurgeon    Quit date: 01/05/2013  . Alcohol Use: No  . Drug Use: No  . Sexual Activity: Not on file   Other Topics Concern  . Not on file   Social History Narrative  . No narrative on file   Family History  Problem Relation Age of Onset  . Colon cancer Neg Hx   . Cancer Maternal Grandfather   . Liver disease Neg Hx      Review of Systems  All other systems reviewed and are negative.       Objective:   Physical Exam  Vitals reviewed. Neck: Neck supple. No JVD present. No thyromegaly present.  Cardiovascular: Normal rate, regular rhythm and normal heart sounds.  Exam reveals no gallop and no friction rub.   No murmur heard. Pulmonary/Chest: Effort normal and breath sounds normal. No respiratory distress. He has no wheezes. He has no rales.  Abdominal: Soft. Bowel sounds are normal. He exhibits no distension. There is no tenderness. There is no rebound and no guarding.  Musculoskeletal: He exhibits no edema.  Lymphadenopathy:    He has no cervical adenopathy.          Assessment & Plan:  1.  Tourette disease I discussed with the patient switching Risperdal to clonidine or luvox.  My primary reason for doing this would be his obesity. I am concerned this probably is causing increased weight gain. Also concerned about the potential development of type 2 diabetes. The patient like to consider this and possibly discuss changing in the future 2. Asthma, chronic Continue Advair. I also gave the patient his flu shot today  3. HTN (hypertension) Blood pressure is currently well controlled. Return fasting for a CMP and fasting lipid panel. Goal LDL is less than 130 - COMPLETE METABOLIC PANEL WITH GFR; Future - Lipid panel; Future  4. Morbid obesity I did recommend diet exercise and weight loss. I would also consider discontinuing Risperdal. 5. Other malaise and fatigue Patient reportedly has a  history of hypogonadism. I will have him return fasting for a testosterone level. - CBC with Differential; Future - Testosterone; Future

## 2013-08-05 ENCOUNTER — Encounter: Payer: Self-pay | Admitting: *Deleted

## 2013-08-05 ENCOUNTER — Other Ambulatory Visit: Payer: Medicare Other

## 2013-08-05 DIAGNOSIS — I10 Essential (primary) hypertension: Secondary | ICD-10-CM | POA: Diagnosis not present

## 2013-08-05 DIAGNOSIS — R5381 Other malaise: Secondary | ICD-10-CM

## 2013-08-05 LAB — COMPLETE METABOLIC PANEL WITH GFR
ALT: 29 U/L (ref 0–53)
CO2: 28 mEq/L (ref 19–32)
Calcium: 9 mg/dL (ref 8.4–10.5)
Chloride: 102 mEq/L (ref 96–112)
GFR, Est African American: >89 mL/min
Sodium: 139 mEq/L (ref 135–145)
Total Bilirubin: 1 mg/dL (ref 0.3–1.2)
Total Protein: 6.9 g/dL (ref 6.0–8.3)

## 2013-08-05 LAB — CBC WITH DIFFERENTIAL/PLATELET
Basophils Absolute: 0 10*3/uL (ref 0.0–0.1)
HCT: 40.5 % (ref 39.0–52.0)
Lymphocytes Relative: 33 % (ref 12–46)
Monocytes Absolute: 0.4 10*3/uL (ref 0.1–1.0)
Neutro Abs: 2.6 10*3/uL (ref 1.7–7.7)
Neutrophils Relative %: 54 % (ref 43–77)
Platelets: 197 10*3/uL (ref 150–400)
RDW: 13.6 % (ref 11.5–15.5)
WBC: 4.8 10*3/uL (ref 4.0–10.5)

## 2013-08-05 LAB — LIPID PANEL
HDL: 40 mg/dL (ref >39)
LDL Cholesterol: 109 mg/dL — ABNORMAL HIGH (ref 0–99)

## 2013-08-05 LAB — TESTOSTERONE: Testosterone: 431 ng/dL (ref 300–890)

## 2013-09-12 ENCOUNTER — Telehealth: Payer: Self-pay | Admitting: Family Medicine

## 2013-09-12 MED ORDER — RISPERIDONE 2 MG PO TABS: 2.0000 mg | ORAL_TABLET | Freq: Three times a day (TID) | ORAL | Status: DC

## 2013-09-12 MED ORDER — ALBUTEROL SULFATE HFA 108 (90 BASE) MCG/ACT IN AERS: 2.0000 | INHALATION_SPRAY | Freq: Four times a day (QID) | RESPIRATORY_TRACT | Status: DC | PRN

## 2013-09-12 NOTE — Telephone Encounter (Signed)
Pt needs a refill on his Risperdal - ok to do? Also his advair has a high copay and he was wondering if there was anything cheaper or if he could get a different kind of inhaler?

## 2013-09-12 NOTE — Telephone Encounter (Signed)
Ok to refill risperdal, there is no generic for advair but he can have a rebate card.

## 2013-09-12 NOTE — Telephone Encounter (Signed)
Pt can not afford Adviar and would like an inhaler for when he has wheezing. Per Dr. Tanya NonesPickard ok to do Ventolin HFA q 6 hours prn wheezing.  Meds sent to pharm.

## 2013-12-17 ENCOUNTER — Other Ambulatory Visit: Payer: Self-pay | Admitting: Family Medicine

## 2013-12-26 ENCOUNTER — Other Ambulatory Visit: Payer: Self-pay | Admitting: Family Medicine

## 2013-12-27 ENCOUNTER — Other Ambulatory Visit: Payer: Self-pay | Admitting: Family Medicine

## 2013-12-30 ENCOUNTER — Telehealth: Payer: Self-pay | Admitting: Family Medicine

## 2013-12-30 NOTE — Telephone Encounter (Signed)
Pt is needing a refill on hisADVAIR DISKUS 250-50 MCG/DOSE AEPB  Pharmacy [facility]    [phone]

## 2013-12-31 MED ORDER — FLUTICASONE-SALMETEROL 250-50 MCG/DOSE IN AEPB: INHALATION_SPRAY | RESPIRATORY_TRACT | Status: DC

## 2013-12-31 NOTE — Telephone Encounter (Signed)
Rx Refilled  

## 2014-01-19 ENCOUNTER — Other Ambulatory Visit: Payer: Self-pay | Admitting: Family Medicine

## 2014-01-19 MED ORDER — RISPERIDONE 2 MG PO TABS: ORAL_TABLET | ORAL | Status: DC

## 2014-01-19 NOTE — Telephone Encounter (Signed)
Resent rx for #90 pills with refill

## 2014-01-28 ENCOUNTER — Other Ambulatory Visit: Payer: Self-pay | Admitting: Family Medicine

## 2014-02-19 ENCOUNTER — Ambulatory Visit (INDEPENDENT_AMBULATORY_CARE_PROVIDER_SITE_OTHER): Payer: Medicare Other | Admitting: Family Medicine

## 2014-02-19 ENCOUNTER — Encounter: Payer: Self-pay | Admitting: Family Medicine

## 2014-02-19 VITALS — BP 126/74 | HR 94 | Temp 97.9°F | Resp 20 | Ht 73.0 in | Wt 388.0 lb

## 2014-02-19 DIAGNOSIS — I10 Essential (primary) hypertension: Secondary | ICD-10-CM

## 2014-02-19 DIAGNOSIS — F952 Tourette's disorder: Secondary | ICD-10-CM | POA: Diagnosis not present

## 2014-02-19 MED ORDER — POTASSIUM CHLORIDE CRYS ER 20 MEQ PO TBCR: 20.0000 meq | EXTENDED_RELEASE_TABLET | Freq: Every day | ORAL | Status: DC

## 2014-02-19 MED ORDER — ALBUTEROL SULFATE HFA 108 (90 BASE) MCG/ACT IN AERS: 2.0000 | INHALATION_SPRAY | Freq: Four times a day (QID) | RESPIRATORY_TRACT | Status: DC | PRN

## 2014-02-19 MED ORDER — RISPERIDONE 2 MG PO TABS: ORAL_TABLET | ORAL | Status: DC

## 2014-02-19 MED ORDER — AMLODIPINE BESYLATE 10 MG PO TABS: ORAL_TABLET | ORAL | Status: DC

## 2014-02-19 MED ORDER — FLUTICASONE-SALMETEROL 250-50 MCG/DOSE IN AEPB: INHALATION_SPRAY | RESPIRATORY_TRACT | Status: DC

## 2014-02-19 MED ORDER — IBUPROFEN 800 MG PO TABS: 800.0000 mg | ORAL_TABLET | Freq: Three times a day (TID) | ORAL | Status: DC | PRN

## 2014-02-19 MED ORDER — HYDROCHLOROTHIAZIDE 25 MG PO TABS: ORAL_TABLET | ORAL | Status: DC

## 2014-02-19 NOTE — Progress Notes (Signed)
Subjective:    Patient ID: Jimmy Miller, male    DOB: Dec 30, 1975, 38 y.o.   MRN: [id number]  HPI Patient presents today for followup of his essential hypertension, morbid obesity, and tourettes disorder.  Blood pressure is well-controlled today 126/74. He denies any chest pain shortness of breath or dyspnea on exertion. He was evaluated for sleep apnea in 2008 and was found to have no evidence of sleep apnea. Unfortunately continues to be obese weighing 388 pounds. He is interested in trying the [employer]. We had a long discussion today regarding his diet. In the past he has tried that and had constipation. He also takes risperdal.  This helped control his vocal and motor tics. He is overdue for a fasting lipid panel and CMP to monitor for adverse effects due to his medication.  He is also been having low back pain in his right side. He is tender to palpation over the lumbar right-sided lumbar paraspinal muscles. He has no midline tenderness to palpation. He denies any sciatica or radiculopathy. Past Medical History  Diagnosis Date  . HTN (hypertension)   . Tourette disorder   . Tobacco abuse   . Colon polyp   . Obesity, morbid, BMI 50 or higher   . Asthma   . Ventral hernia    Past Surgical History  Procedure Laterality Date  . Colonoscopy  2011    [facility]: 9mm tubulovillous adenoma removed from rectum  . Abdominal hernia repair      as a baby  . Colonoscopy  2008     [facility]: ICV erythematous (bx lymphoid aggregate but no active inflammation or granulomas). TRV colon polyp 9mm (features of juvenile polyp, focal non-caseating granuloma)  . Colonoscopy N/A 12/11/2012    Procedure: COLONOSCOPY;  Surgeon: Corbin Ade, MD;  Location: [facility];  Service: Endoscopy;  Laterality: N/A;  12:30   Current Outpatient Prescriptions on File Prior to Visit  Medication Sig Dispense Refill  . albuterol (PROVENTIL HFA;VENTOLIN HFA) 108 (90 BASE) MCG/ACT inhaler Inhale 2  puffs into the lungs every 6 (six) hours as needed for wheezing or shortness of breath.  1 Inhaler  3  . amLODipine (NORVASC) 10 MG tablet TAKE (1) TABLET BY MOUTH ONCE DAILY FOR BLOOD PRESSURE.  30 tablet  5  . Fluticasone-Salmeterol (ADVAIR DISKUS) 250-50 MCG/DOSE AEPB INHALE 1 PUFF BY MOUTH TWICE A DAY  60 each  2  . hydrochlorothiazide (HYDRODIURIL) 25 MG tablet TAKE (1) TABLET BY MOUTH ONCE DAILY FOR BLOOD PRESSURE.  30 tablet  1  . potassium chloride SA (K-DUR,KLOR-CON) 20 MEQ tablet Take 20 mEq by mouth daily.       . risperiDONE (RISPERDAL) 2 MG tablet TAKE (1) TABLET BY MOUTH (3) TIMES DAILY.  90 tablet  1   No current facility-administered medications on file prior to visit.   Allergies  Allergen Reactions  . Benazepril Swelling    Mouth swelling   History   Social History  . Marital Status: Legally Separated    Spouse Name: N/A    Number of Children: 0  . Years of Education: N/A   Occupational History  . disability    Social History Main Topics  . Smoking status: Former Smoker -- 0.50 packs/day    Types: Cigarettes  . Smokeless tobacco: Former Neurosurgeon    Quit date: 01/05/2013  . Alcohol Use: No  . Drug Use: No  . Sexual Activity: Not on file   Other Topics Concern  .  Not on file   Social History Narrative  . No narrative on file      Review of Systems  All other systems reviewed and are negative.      Objective:   Physical Exam  Vitals reviewed. Constitutional: He is oriented to person, place, and time.  Cardiovascular: Normal rate, regular rhythm, normal heart sounds and intact distal pulses.   No murmur heard. Pulmonary/Chest: Effort normal and breath sounds normal. No respiratory distress. He has no wheezes. He has no rales. He exhibits no tenderness.  Abdominal: Soft. Bowel sounds are normal. He exhibits no distension and no mass. There is no tenderness. There is no rebound and no guarding.  Musculoskeletal: He exhibits no edema.  Neurological: He  is alert and oriented to person, place, and time. He has normal reflexes. He displays normal reflexes. No cranial nerve deficit. He exhibits normal muscle tone. Coordination normal.          Assessment & Plan:  1. Tourette disease Continue risperdal.  I would like the patient to return fasting for a CMP and fasting lipid panel to monitor for hyperlipidemia as well as hyperglycemia.  2. Essential hypertension Depression well controlled. I will make no changes in his medication at this time. - COMPLETE METABOLIC PANEL WITH GFR; Future - Lipid panel; Future - CBC with Differential; Future  3. Morbid obesity Like the patient to try the Atkins diet. We have also discussed adding fiber to his diet to help prevent constipation along with drinking plenty of water to help prevent constipation. We also discussed exercise.

## 2014-03-27 DIAGNOSIS — H6120 Impacted cerumen, unspecified ear: Secondary | ICD-10-CM | POA: Diagnosis not present

## 2014-08-12 ENCOUNTER — Other Ambulatory Visit: Payer: Self-pay | Admitting: Family Medicine

## 2014-08-12 NOTE — Telephone Encounter (Signed)
Refill appropriate and filled per protocol. 

## 2014-08-21 ENCOUNTER — Encounter: Payer: Self-pay | Admitting: Family Medicine

## 2014-08-21 ENCOUNTER — Other Ambulatory Visit: Payer: Self-pay | Admitting: Family Medicine

## 2014-08-21 NOTE — Telephone Encounter (Signed)
Medication refill for one time only.  Patient needs to be seen.  Letter sent for patient to call and schedule 

## 2014-09-14 ENCOUNTER — Ambulatory Visit: Payer: Medicare Other | Admitting: Family Medicine

## 2014-09-15 ENCOUNTER — Encounter: Payer: Self-pay | Admitting: Family Medicine

## 2014-09-15 ENCOUNTER — Ambulatory Visit (INDEPENDENT_AMBULATORY_CARE_PROVIDER_SITE_OTHER): Payer: Managed Care, Other (non HMO) | Admitting: Family Medicine

## 2014-09-15 VITALS — BP 110/68 | HR 78 | Temp 98.1°F | Resp 20 | Ht 73.0 in | Wt 396.0 lb

## 2014-09-15 DIAGNOSIS — Z79899 Other long term (current) drug therapy: Secondary | ICD-10-CM

## 2014-09-15 DIAGNOSIS — F952 Tourette's disorder: Secondary | ICD-10-CM

## 2014-09-15 DIAGNOSIS — I10 Essential (primary) hypertension: Secondary | ICD-10-CM

## 2014-09-15 LAB — COMPLETE METABOLIC PANEL WITH GFR
ALK PHOS: 128 U/L — AB (ref 39–117)
ALT: 29 U/L (ref 0–53)
AST: 20 U/L (ref 0–37)
Albumin: 3.9 g/dL (ref 3.5–5.2)
BILIRUBIN TOTAL: 0.6 mg/dL (ref 0.2–1.2)
BUN: 12 mg/dL (ref 6–23)
CO2: 27 meq/L (ref 19–32)
Calcium: 9.1 mg/dL (ref 8.4–10.5)
Chloride: 102 mEq/L (ref 96–112)
Creat: 0.97 mg/dL (ref 0.50–1.35)
GFR, Est Non African American: >89 mL/min
GLUCOSE: 88 mg/dL (ref 70–99)
Potassium: 3.8 mEq/L (ref 3.5–5.3)
SODIUM: 139 meq/L (ref 135–145)
TOTAL PROTEIN: 7.1 g/dL (ref 6.0–8.3)

## 2014-09-15 LAB — CBC WITH DIFFERENTIAL/PLATELET
BASOS ABS: 0 10*3/uL (ref 0.0–0.1)
BASOS PCT: 1 % (ref 0–1)
EOS ABS: 0.1 10*3/uL (ref 0.0–0.7)
Eosinophils Relative: 3 % (ref 0–5)
HCT: 42.7 % (ref 39.0–52.0)
Hemoglobin: 14.5 g/dL (ref 13.0–17.0)
Lymphocytes Relative: 35 % (ref 12–46)
Lymphs Abs: 1.6 10*3/uL (ref 0.7–4.0)
MCH: 28.1 pg (ref 26.0–34.0)
MCHC: 34 g/dL (ref 30.0–36.0)
MCV: 82.8 fL (ref 78.0–100.0)
MPV: 11.2 fL (ref 8.6–12.4)
Monocytes Absolute: 0.4 10*3/uL (ref 0.1–1.0)
Monocytes Relative: 9 % (ref 3–12)
NEUTROS ABS: 2.4 10*3/uL (ref 1.7–7.7)
NEUTROS PCT: 52 % (ref 43–77)
PLATELETS: 209 10*3/uL (ref 150–400)
RBC: 5.16 MIL/uL (ref 4.22–5.81)
RDW: 13.6 % (ref 11.5–15.5)
WBC: 4.7 10*3/uL (ref 4.0–10.5)

## 2014-09-15 LAB — LIPID PANEL
CHOL/HDL RATIO: 3.3 ratio
CHOLESTEROL: 170 mg/dL (ref 0–200)
HDL: 52 mg/dL (ref >39)
LDL Cholesterol: 107 mg/dL — ABNORMAL HIGH (ref 0–99)
TRIGLYCERIDES: 54 mg/dL (ref <150)
VLDL: 11 mg/dL (ref 0–40)

## 2014-09-15 NOTE — Progress Notes (Signed)
Subjective:    Patient ID: Jimmy Miller, male    DOB: [birth year], 39 y.o.   MRN: [id number]  HPI Patient is a very pleasant 39 year old African-American man who is here today for medicine check. Unfortunately he has gained 8 additional pounds since when I last saw him in the summer. He states that he is trying exercise 20-30 minutes every day on an exercise bike. He has not implemented any dietary changes as of yet. He denies any symptoms of sleep apnea. He states he was checked for sleep apnea in 2008 and his sleep study was normal. His blood pressure today is well controlled at 110/68. He denies any chest pain shortness of breath or dyspnea on exertion. He is due for fasting lab work to help monitor for hyperlipidemia given his obesity as well as his high risk medication respiratory. He is currently on Risperdal for Tourette syndrome. Patient states that without the Risperdal distress syndrome will worsen dramatically. The medication helps control his vocal tics/outburst. He is willing to accept the fact that medication is contributing some to his weight gain. He would be willing to meet with a nutritionist. Past Medical History  Diagnosis Date  . HTN (hypertension)   . Tourette disorder   . Tobacco abuse   . Colon polyp   . Obesity, morbid, BMI 50 or higher   . Asthma   . Ventral hernia    Past Surgical History  Procedure Laterality Date  . Colonoscopy  2011    [facility]: 9mm tubulovillous adenoma removed from rectum  . Abdominal hernia repair      as a baby  . Colonoscopy  2008    [facility]: ICV erythematous (bx lymphoid aggregate but no active inflammation or granulomas). TRV colon polyp 9mm (features of juvenile polyp, focal non-caseating granuloma)  . Colonoscopy N/A 12/11/2012    Procedure: COLONOSCOPY;  Surgeon: Corbin Adeobert M Rourk, MD;  Location: [facility];  Service: Endoscopy;  Laterality: N/A;  12:30   Current Outpatient Prescriptions on File Prior to Visit    Medication Sig Dispense Refill  . ADVAIR DISKUS 250-50 MCG/DOSE AEPB INHALE 1 PUFF TWICE DAILY. 60 each 11  . albuterol (PROVENTIL HFA;VENTOLIN HFA) 108 (90 BASE) MCG/ACT inhaler Inhale 2 puffs into the lungs every 6 (six) hours as needed for wheezing or shortness of breath. 1 Inhaler 3  . amLODipine (NORVASC) 10 MG tablet TAKE (1) TABLET BY MOUTH ONCE DAILY FOR BLOOD PRESSURE. 30 tablet 5  . hydrochlorothiazide (HYDRODIURIL) 25 MG tablet TAKE 1 TABLET BY MOUTH ONCE A DAY. 30 tablet 0  . ibuprofen (ADVIL,MOTRIN) 800 MG tablet Take 1 tablet (800 mg total) by mouth every 8 (eight) hours as needed. 90 tablet 0  . potassium chloride SA (K-DUR,KLOR-CON) 20 MEQ tablet TAKE ONE TABLET BY MOUTH ONCE DAILY. 30 tablet 6  . risperiDONE (RISPERDAL) 2 MG tablet TAKE (1) TABLET BY MOUTH (3) TIMES DAILY. 90 tablet 6   No current facility-administered medications on file prior to visit.   Allergies  Allergen Reactions  . Benazepril Swelling    Mouth swelling   History   Social History  . Marital Status: Legally Separated    Spouse Name: N/A    Number of Children: 0  . Years of Education: N/A   Occupational History  . disability    Social History Main Topics  . Smoking status: Former Smoker -- 0.50 packs/day    Types: Cigarettes  . Smokeless tobacco: Former Careers information officerUser    Quit  date: 01/05/2013  . Alcohol Use: No  . Drug Use: No  . Sexual Activity: Not on file   Other Topics Concern  . Not on file   Social History Narrative      Review of Systems  All other systems reviewed and are negative.      Objective:   Physical Exam  Constitutional: He appears well-developed and well-nourished.  Cardiovascular: Normal rate, regular rhythm and normal heart sounds.   No murmur heard. Pulmonary/Chest: Effort normal and breath sounds normal. No respiratory distress. He has no wheezes. He has no rales.  Abdominal: Soft. Bowel sounds are normal. He exhibits no distension. There is no tenderness.  There is no rebound and no guarding.  Musculoskeletal: He exhibits no edema.  Vitals reviewed.         Assessment & Plan:  Tourette disease  Essential hypertension - Plan: COMPLETE METABOLIC PANEL WITH GFR, CBC with Differential/Platelet, Lipid panel  High risk medication use - Plan: COMPLETE METABOLIC PANEL WITH GFR, Lipid panel  Morbid obesity - Plan: COMPLETE METABOLIC PANEL WITH GFR, Lipid panel  Patient's blood pressures well controlled. Patient satisfied with the control he has of his Tourette's at the present time. He does not want to change any of his medication. I will check a CBC, CMP, and a fasting lipid panel. I will also schedule the patient to meet with a nutritionist. I recommended 1500 cal a day total. I recommended 30 minutes to an hour a day of vigorous aerobic exercise. Recheck in 6 months.

## 2014-09-16 ENCOUNTER — Encounter: Payer: Self-pay | Admitting: Family Medicine

## 2014-09-17 ENCOUNTER — Telehealth: Payer: Self-pay | Admitting: Family Medicine

## 2014-09-17 NOTE — Telephone Encounter (Signed)
Patient calling about lab results  [phone]

## 2014-09-17 NOTE — Telephone Encounter (Signed)
Patient aware of results.

## 2014-10-19 ENCOUNTER — Other Ambulatory Visit: Payer: Self-pay | Admitting: Family Medicine

## 2014-11-02 ENCOUNTER — Ambulatory Visit: Payer: Medicare Other | Admitting: Nutrition

## 2014-12-07 ENCOUNTER — Telehealth: Payer: Self-pay | Admitting: Nutrition

## 2014-12-07 NOTE — Telephone Encounter (Signed)
Left message to call to reschedule missed appointment on VM of cell phone. Home phone number not in working order. Norm SaltPenny Finch Costanzo, RDN< CDE

## 2014-12-10 ENCOUNTER — Ambulatory Visit: Payer: Medicare Other | Admitting: Nutrition

## 2014-12-10 ENCOUNTER — Telehealth: Payer: Self-pay | Admitting: Nutrition

## 2014-12-10 NOTE — Telephone Encounter (Signed)
Phone number not working number. Norm SaltPenny Crumpton, RDN CDE

## 2014-12-28 ENCOUNTER — Ambulatory Visit (INDEPENDENT_AMBULATORY_CARE_PROVIDER_SITE_OTHER): Payer: Managed Care, Other (non HMO) | Admitting: Family Medicine

## 2014-12-28 ENCOUNTER — Encounter: Payer: Self-pay | Admitting: Family Medicine

## 2014-12-28 VITALS — BP 110/68 | HR 74 | Temp 98.4°F | Resp 18 | Ht 73.0 in | Wt 388.0 lb

## 2014-12-28 DIAGNOSIS — H6123 Impacted cerumen, bilateral: Secondary | ICD-10-CM

## 2014-12-28 NOTE — Progress Notes (Signed)
Subjective:    Patient ID: Jimmy Miller, male    DOB: 05-Jan-1976, 39 y.o.   MRN: [id number]  HPI  Patient reports mild bilateral hearing loss. He states that both ears feel like they're occluded. On examination today he has bilateral cerumen impactions. He is requesting that we removed these with irrigation. He is also asking me to fill out a form for the DMV. Apparently he is on the list requiring medical evaluations to be cleared for a regular driver's license. The patient has no history of obstructive sleep apnea. Aside from his morbid obesity his only medical problems include essential hypertension that is well controlled on hydrochlorothiazide and amlodipine. He also has Tourette's disorder. His wrist is limited only to vocal tics. He denies any motor tics. His Tourette's is well controlled with Risperdal. This is the only other reason I can imagine why they are requesting that he have medical evaluation. He denies any recent motor vehicle accidents. He denies any loss of consciousness. He denies any seizure activity. He denies any uncontrolled motor movements.  Patient has had his driver's license since he was 54 and has never been involved in a motor vehicle accident according to his report. Past Medical History  Diagnosis Date  . HTN (hypertension)   . Tourette disorder   . Tobacco abuse   . Colon polyp   . Obesity, morbid, BMI 50 or higher   . Asthma   . Ventral hernia    Past Surgical History  Procedure Laterality Date  . Colonoscopy  2011    [facility]: 9mm tubulovillous adenoma removed from rectum  . Abdominal hernia repair      as a baby  . Colonoscopy  2008    [facility]: ICV erythematous (bx lymphoid aggregate but no active inflammation or granulomas). TRV colon polyp 9mm (features of juvenile polyp, focal non-caseating granuloma)  . Colonoscopy N/A 12/11/2012    Procedure: COLONOSCOPY;  Surgeon: Corbin Ade, MD;  [facility]: [facility];  Service:  Endoscopy;  Laterality: N/A;  12:30   Current Outpatient Prescriptions on File Prior to Visit  Medication Sig Dispense Refill  . ADVAIR DISKUS 250-50 MCG/DOSE AEPB INHALE 1 PUFF TWICE DAILY. 60 each 11  . albuterol (PROVENTIL HFA;VENTOLIN HFA) 108 (90 BASE) MCG/ACT inhaler Inhale 2 puffs into the lungs every 6 (six) hours as needed for wheezing or shortness of breath. 1 Inhaler 3  . amLODipine (NORVASC) 10 MG tablet TAKE (1) TABLET BY MOUTH ONCE DAILY FOR BLOOD PRESSURE. 30 tablet 5  . hydrochlorothiazide (HYDRODIURIL) 25 MG tablet TAKE 1 TABLET BY MOUTH ONCE A DAY. 30 tablet 11  . ibuprofen (ADVIL,MOTRIN) 800 MG tablet Take 1 tablet (800 mg total) by mouth every 8 (eight) hours as needed. 90 tablet 0  . potassium chloride SA (K-DUR,KLOR-CON) 20 MEQ tablet TAKE ONE TABLET BY MOUTH ONCE DAILY. 30 tablet 6  . risperiDONE (RISPERDAL) 2 MG tablet TAKE (1) TABLET BY MOUTH (3) TIMES DAILY. 90 tablet 6   No current facility-administered medications on file prior to visit.   Allergies  Allergen Reactions  . Benazepril Swelling    Mouth swelling   History   Social History  . Marital Status: Legally Separated    Spouse Name: N/A  . Number of Children: 0  . Years of Education: N/A   Occupational History  . disability    Social History Main Topics  . Smoking status: Former Smoker -- 0.50 packs/day    Types: Cigarettes  .  Smokeless tobacco: Former NeurosurgeonUser    Quit date: 01/05/2013  . Alcohol Use: No  . Drug Use: No  . Sexual Activity: Not on file   Other Topics Concern  . Not on file   Social History Narrative     Review of Systems  All other systems reviewed and are negative.      Objective:   Physical Exam  Constitutional: He is oriented to person, place, and time. He appears well-developed and well-nourished. No distress.  Cardiovascular: Normal rate, regular rhythm and normal heart sounds.   No murmur heard. Pulmonary/Chest: Effort normal and breath sounds normal. No  respiratory distress. He has no wheezes. He has no rales.  Abdominal: Soft. Bowel sounds are normal.  Neurological: He is alert and oriented to person, place, and time. He has normal reflexes. He displays normal reflexes. No cranial nerve deficit. He exhibits normal muscle tone. Coordination normal.  Skin: He is not diaphoretic.  Psychiatric: He has a normal mood and affect. His behavior is normal. Judgment and thought content normal.  Vitals reviewed.         Assessment & Plan:  Cerumen impaction, bilateral  Patient has bilateral cerumen impactions which are mild. These were removed easily with irrigation/lavage. His blood pressures well controlled. His Tourette's syndrome is well controlled. I see no reason the patient cannot operate a motor vehicle. I completed his DMV form and I requested that they removed him from the medical surveillance list.

## 2015-02-20 ENCOUNTER — Other Ambulatory Visit: Payer: Self-pay | Admitting: Family Medicine

## 2015-03-22 ENCOUNTER — Ambulatory Visit: Payer: Managed Care, Other (non HMO) | Admitting: Family Medicine

## 2015-05-17 ENCOUNTER — Ambulatory Visit: Payer: Medicare Other | Admitting: Nutrition

## 2015-05-17 ENCOUNTER — Ambulatory Visit: Payer: Managed Care, Other (non HMO) | Admitting: Family Medicine

## 2015-05-31 ENCOUNTER — Ambulatory Visit: Payer: Managed Care, Other (non HMO) | Admitting: Family Medicine

## 2015-07-02 ENCOUNTER — Other Ambulatory Visit: Payer: Self-pay | Admitting: Family Medicine

## 2015-07-05 ENCOUNTER — Other Ambulatory Visit: Payer: Self-pay | Admitting: Family Medicine

## 2015-07-05 MED ORDER — ALBUTEROL SULFATE HFA 108 (90 BASE) MCG/ACT IN AERS: 2.0000 | INHALATION_SPRAY | Freq: Four times a day (QID) | RESPIRATORY_TRACT | Status: DC | PRN

## 2015-07-18 ENCOUNTER — Other Ambulatory Visit: Payer: Self-pay | Admitting: Family Medicine

## 2015-08-24 ENCOUNTER — Other Ambulatory Visit: Payer: Self-pay | Admitting: Family Medicine

## 2015-08-24 MED ORDER — FLUTICASONE-SALMETEROL 250-50 MCG/DOSE IN AEPB: INHALATION_SPRAY | RESPIRATORY_TRACT | Status: DC

## 2015-08-24 MED ORDER — AMLODIPINE BESYLATE 10 MG PO TABS: ORAL_TABLET | ORAL | Status: DC

## 2015-08-24 MED ORDER — POTASSIUM CHLORIDE CRYS ER 20 MEQ PO TBCR: 20.0000 meq | EXTENDED_RELEASE_TABLET | Freq: Every day | ORAL | Status: DC

## 2015-08-27 ENCOUNTER — Other Ambulatory Visit: Payer: Self-pay | Admitting: *Deleted

## 2015-08-27 ENCOUNTER — Other Ambulatory Visit: Payer: Self-pay | Admitting: Family Medicine

## 2015-08-27 MED ORDER — POTASSIUM CHLORIDE CRYS ER 20 MEQ PO TBCR: 20.0000 meq | EXTENDED_RELEASE_TABLET | Freq: Every day | ORAL | Status: DC

## 2015-08-27 MED ORDER — AMLODIPINE BESYLATE 10 MG PO TABS: ORAL_TABLET | ORAL | Status: DC

## 2015-08-27 MED ORDER — FLUTICASONE-SALMETEROL 250-50 MCG/DOSE IN AEPB: INHALATION_SPRAY | RESPIRATORY_TRACT | Status: DC

## 2015-08-27 NOTE — Telephone Encounter (Signed)
Received fax requesting refill on Norvasc.   Medication filled x1 with no refills.   Requires office visit before any further refills can be given.   Letter sent.

## 2015-09-13 ENCOUNTER — Encounter: Payer: Self-pay | Admitting: Family Medicine

## 2015-09-13 ENCOUNTER — Ambulatory Visit (INDEPENDENT_AMBULATORY_CARE_PROVIDER_SITE_OTHER): Payer: Managed Care, Other (non HMO) | Admitting: Family Medicine

## 2015-09-13 VITALS — BP 138/84 | HR 98 | Temp 98.3°F | Resp 20 | Wt 396.0 lb

## 2015-09-13 DIAGNOSIS — Z79899 Other long term (current) drug therapy: Secondary | ICD-10-CM

## 2015-09-13 DIAGNOSIS — F952 Tourette's disorder: Secondary | ICD-10-CM

## 2015-09-13 DIAGNOSIS — I10 Essential (primary) hypertension: Secondary | ICD-10-CM

## 2015-09-13 MED ORDER — FLUTICASONE-SALMETEROL 250-50 MCG/DOSE IN AEPB: INHALATION_SPRAY | RESPIRATORY_TRACT | Status: DC

## 2015-09-13 MED ORDER — RISPERIDONE 2 MG PO TABS: ORAL_TABLET | ORAL | Status: DC

## 2015-09-13 MED ORDER — AMLODIPINE BESYLATE 10 MG PO TABS: ORAL_TABLET | ORAL | Status: DC

## 2015-09-13 MED ORDER — HYDROCHLOROTHIAZIDE 25 MG PO TABS: 25.0000 mg | ORAL_TABLET | Freq: Every day | ORAL | Status: DC

## 2015-09-13 MED ORDER — POTASSIUM CHLORIDE CRYS ER 20 MEQ PO TBCR: 20.0000 meq | EXTENDED_RELEASE_TABLET | Freq: Every day | ORAL | Status: DC

## 2015-09-13 NOTE — Progress Notes (Signed)
Subjective:    Patient ID: Jimmy Miller, male    DOB: [birth year], 40 y.o.   MRN: [id number]  HPI  Patient is a very pleasant 40 year old African-American man who is here today for medicine check. Unfortunately he has gained another 8 additional pounds since when I last saw him last. He is not exercising.  He is not watching his diet. He denies any symptoms of sleep apnea. He states he was checked for sleep apnea in 2008 and his sleep study was normal. His blood pressure today is well controlled at 138/84. He denies any chest pain shortness of breath or dyspnea on exertion. He is due for fasting lab work to help monitor for hyperlipidemia given his obesity as well as his high risk medication respiratory. He is currently on Risperdal for Tourette syndrome. Patient states that without the Risperdal his Tourette's syndrome will worsen dramatically. The medication helps control his vocal tics/outburst. He is willing to accept the fact that medication is contributing some to his weight gain.  Past Medical History  Diagnosis Date  . HTN (hypertension)   . Tourette disorder   . Tobacco abuse   . Colon polyp   . Obesity, morbid, BMI 50 or higher (HCC)   . Asthma   . Ventral hernia    Past Surgical History  Procedure Laterality Date  . Colonoscopy  2011    [facility]: 9mm tubulovillous adenoma removed from rectum  . Abdominal hernia repair      as a baby  . Colonoscopy  2008     [facility]: ICV erythematous (bx lymphoid aggregate but no active inflammation or granulomas). TRV colon polyp 9mm (features of juvenile polyp, focal non-caseating granuloma)  . Colonoscopy N/A 12/11/2012    Procedure: COLONOSCOPY;  Surgeon: Corbin Ade, MD;  [facility];  Service: Endoscopy;  Laterality: N/A;  12:30   Current Outpatient Prescriptions on File Prior to Visit  Medication Sig Dispense Refill  . albuterol (PROVENTIL HFA;VENTOLIN HFA) 108 (90 BASE) MCG/ACT inhaler Inhale 2 puffs  into the lungs every 6 (six) hours as needed for wheezing or shortness of breath. 1 Inhaler 3  . ibuprofen (ADVIL,MOTRIN) 800 MG tablet Take 1 tablet (800 mg total) by mouth every 8 (eight) hours as needed. 90 tablet 0   No current facility-administered medications on file prior to visit.   Allergies  Allergen Reactions  . Benazepril Swelling    Mouth swelling   Social History   Social History  . Marital Status: Legally Separated    Spouse Name: N/A  . Number of Children: 0  . Years of Education: N/A   Occupational History  . disability    Social History Main Topics  . Smoking status: Former Smoker -- 0.50 packs/day    Types: Cigarettes  . Smokeless tobacco: Former Neurosurgeon    Quit date: 01/05/2013  . Alcohol Use: No  . Drug Use: No  . Sexual Activity: Not on file   Other Topics Concern  . Not on file   Social History Narrative      Review of Systems  All other systems reviewed and are negative.      Objective:   Physical Exam  Constitutional: He appears well-developed and well-nourished.  Cardiovascular: Normal rate, regular rhythm and normal heart sounds.   No murmur heard. Pulmonary/Chest: Effort normal and breath sounds normal. No respiratory distress. He has no wheezes. He has no rales.  Abdominal: Soft. Bowel sounds are normal. He exhibits no distension.  There is no tenderness. There is no rebound and no guarding.  Musculoskeletal: He exhibits no edema.  Vitals reviewed.         Assessment & Plan:  High risk medication use - Plan: COMPLETE METABOLIC PANEL WITH GFR, Lipid panel, CBC with Differential/Platelet  Morbid obesity due to excess calories (HCC) - Plan: COMPLETE METABOLIC PANEL WITH GFR, Lipid panel, CBC with Differential/Platelet  Essential hypertension - Plan: COMPLETE METABOLIC PANEL WITH GFR, Lipid panel, CBC with Differential/Platelet  Tourette disease  Patient's blood pressures well controlled. Patient satisfied with the control he has  of his Tourette's at the present time. He does not want to change any of his medication. I will check a CBC, CMP, and a fasting lipid panel. I recommended 1500 cal a day total. I recommended 30 minutes to an hour a day of vigorous aerobic exercise. Recheck in 6 months.  We also discussed gastric bypass but he is not interested at present.

## 2016-03-13 ENCOUNTER — Ambulatory Visit: Payer: Self-pay | Admitting: Family Medicine

## 2016-04-07 ENCOUNTER — Ambulatory Visit (INDEPENDENT_AMBULATORY_CARE_PROVIDER_SITE_OTHER): Payer: Self-pay | Admitting: Family Medicine

## 2016-04-07 ENCOUNTER — Encounter: Payer: Self-pay | Admitting: Family Medicine

## 2016-04-07 VITALS — BP 148/78 | HR 76 | Temp 98.9°F | Resp 14 | Ht 73.0 in | Wt 398.0 lb

## 2016-04-07 DIAGNOSIS — I10 Essential (primary) hypertension: Secondary | ICD-10-CM

## 2016-04-07 DIAGNOSIS — Z79899 Other long term (current) drug therapy: Secondary | ICD-10-CM

## 2016-04-07 DIAGNOSIS — Z23 Encounter for immunization: Secondary | ICD-10-CM

## 2016-04-07 LAB — COMPLETE METABOLIC PANEL WITH GFR
ALT: 33 U/L (ref 9–46)
AST: 23 U/L (ref 10–40)
Albumin: 4.2 g/dL (ref 3.6–5.1)
Alkaline Phosphatase: 122 U/L — ABNORMAL HIGH (ref 40–115)
BILIRUBIN TOTAL: 0.8 mg/dL (ref 0.2–1.2)
BUN: 12 mg/dL (ref 7–25)
CHLORIDE: 102 mmol/L (ref 98–110)
CO2: 30 mmol/L (ref 20–31)
CREATININE: 1.16 mg/dL (ref 0.60–1.35)
Calcium: 9.2 mg/dL (ref 8.6–10.3)
GFR, Est African American: >89 mL/min (ref >=60)
GFR, Est Non African American: 78 mL/min (ref >=60)
Glucose, Bld: 89 mg/dL (ref 70–99)
Potassium: 3.9 mmol/L (ref 3.5–5.3)
Sodium: 140 mmol/L (ref 135–146)
TOTAL PROTEIN: 7.2 g/dL (ref 6.1–8.1)

## 2016-04-07 LAB — CBC WITH DIFFERENTIAL/PLATELET
BASOS PCT: 0 %
Basophils Absolute: 0 cells/uL (ref 0–200)
EOS ABS: 144 {cells}/uL (ref 15–500)
Eosinophils Relative: 3 %
HEMATOCRIT: 42.4 % (ref 38.5–50.0)
Hemoglobin: 14.3 g/dL (ref 13.0–17.0)
LYMPHS PCT: 30 %
Lymphs Abs: 1440 cells/uL (ref 850–3900)
MCH: 27.9 pg (ref 27.0–33.0)
MCHC: 33.7 g/dL (ref 32.0–36.0)
MCV: 82.8 fL (ref 80.0–100.0)
MONO ABS: 432 {cells}/uL (ref 200–950)
MONOS PCT: 9 %
MPV: 11.3 fL (ref 7.5–12.5)
NEUTROS ABS: 2784 {cells}/uL (ref 1500–7800)
Neutrophils Relative %: 58 %
PLATELETS: 212 10*3/uL (ref 140–400)
RBC: 5.12 MIL/uL (ref 4.20–5.80)
RDW: 13.7 % (ref 11.0–15.0)
WBC: 4.8 10*3/uL (ref 3.8–10.8)

## 2016-04-07 LAB — LIPID PANEL
CHOL/HDL RATIO: 3.5 ratio (ref <=5.0)
Cholesterol: 181 mg/dL (ref 125–200)
HDL: 52 mg/dL (ref >=40)
LDL CALC: 116 mg/dL (ref <130)
TRIGLYCERIDES: 65 mg/dL (ref <150)
VLDL: 13 mg/dL (ref <30)

## 2016-04-07 NOTE — Addendum Note (Signed)
Addended by: Phillips OdorSIX, Jossiah Smoak H on: 04/07/2016 08:22 AM   Modules accepted: Orders

## 2016-04-07 NOTE — Progress Notes (Signed)
Subjective:    Patient ID: Jimmy Miller, male    DOB: 12-28-1975, 40 y.o.   MRN: [id number]  HPI  09/13/15 Patient is a very pleasant 40 year old African-American man who is here today for medicine check. Unfortunately he has gained another 8 additional pounds since when I last saw him last. He is not exercising.  He is not watching his diet. He denies any symptoms of sleep apnea. He states he was checked for sleep apnea in 2008 and his sleep study was normal. His blood pressure today is well controlled at 138/84. He denies any chest pain shortness of breath or dyspnea on exertion. He is due for fasting lab work to help monitor for hyperlipidemia given his obesity as well as his high risk medication respiratory. He is currently on Risperdal for Tourette syndrome. Patient states that without the Risperdal his Tourette's syndrome will worsen dramatically. The medication helps control his vocal tics/outburst. He is willing to accept the fact that medication is contributing some to his weight gain. At that time, my plan was: Patient's blood pressure is well controlled. Patient satisfied with the control he has of his Tourette's at the present time. He does not want to change any of his medication. I will check a CBC, CMP, and a fasting lipid panel. I recommended 1500 cal a day total. I recommended 30 minutes to an hour a day of vigorous aerobic exercise. Recheck in 6 months.  We also discussed gastric bypass but he is not interested at present.    04/07/16 Apparently, the patient never returned for fasting labwork as the last labs I have are from 09/2014.  Patient's blood pressure is up significantly and he continues to gain weight.  Unfortunately he is also smoking. I will allow the patient to sit in the exam room and then I rechecked his blood pressure. I got his blood pressure 138/82 which was better. The patient is not checking his blood pressure home. He is also not exercising. He is not adhering to any  specific diet. He denies any chest pain shortness of breath or dyspnea on exertion. He denies any trouble breathing. He denies any hypersomnia, loud snoring, or apneic episodes Past Medical History:  Diagnosis Date  . Asthma   . Colon polyp   . HTN (hypertension)   . Obesity, morbid, BMI 50 or higher (HCC)   . Tobacco abuse   . Tourette disorder   . Ventral hernia    Past Surgical History:  Procedure Laterality Date  . ABDOMINAL HERNIA REPAIR     as a baby  . COLONOSCOPY  2011   [facility]: 9mm tubulovillous adenoma removed from rectum  . COLONOSCOPY  2008   [facility]: ICV erythematous (bx lymphoid aggregate but no active inflammation or granulomas). TRV colon polyp 9mm (features of juvenile polyp, focal non-caseating granuloma)  . COLONOSCOPY N/A 12/11/2012   Procedure: COLONOSCOPY;  Surgeon: Corbin Adeobert M Rourk, MD;  Location: [facility];  Service: Endoscopy;  Laterality: N/A;  12:30   Current Outpatient Prescriptions on File Prior to Visit  Medication Sig Dispense Refill  . albuterol (PROVENTIL HFA;VENTOLIN HFA) 108 (90 BASE) MCG/ACT inhaler Inhale 2 puffs into the lungs every 6 (six) hours as needed for wheezing or shortness of breath. 1 Inhaler 3  . amLODipine (NORVASC) 10 MG tablet TAKE (1) TABLET BY MOUTH ONCE DAILY FOR BLOOD PRESSURE. 90 tablet 3  . Fluticasone-Salmeterol (ADVAIR DISKUS) 250-50 MCG/DOSE AEPB INHALE 1 PUFF TWICE DAILY. 180 each 5  .  hydrochlorothiazide (HYDRODIURIL) 25 MG tablet Take 1 tablet (25 mg total) by mouth daily. 90 tablet 3  . ibuprofen (ADVIL,MOTRIN) 800 MG tablet Take 1 tablet (800 mg total) by mouth every 8 (eight) hours as needed. 90 tablet 0  . potassium chloride SA (K-DUR,KLOR-CON) 20 MEQ tablet Take 1 tablet (20 mEq total) by mouth daily. 90 tablet 3  . risperiDONE (RISPERDAL) 2 MG tablet TAKE (1) TABLET BY MOUTH (3) TIMES DAILY. 90 tablet 5   No current facility-administered medications on file prior to visit.    Allergies    Allergen Reactions  . Benazepril Swelling    Mouth swelling   Social History   Social History  . Marital status: Legally Separated    Spouse name: N/A  . Number of children: 0  . Years of education: N/A   Occupational History  . disability [employer]   Social History Main Topics  . Smoking status: Former Smoker    Packs/day: 0.50    Types: Cigarettes  . Smokeless tobacco: Former Neurosurgeon    Quit date: 01/05/2013  . Alcohol use No  . Drug use: No  . Sexual activity: Not on file   Other Topics Concern  . Not on file   Social History Narrative  . No narrative on file      Review of Systems  All other systems reviewed and are negative.      Objective:   Physical Exam  Constitutional: He appears well-developed and well-nourished.  Cardiovascular: Normal rate, regular rhythm and normal heart sounds.   No murmur heard. Pulmonary/Chest: Effort normal and breath sounds normal. No respiratory distress. He has no wheezes. He has no rales.  Abdominal: Soft. Bowel sounds are normal. He exhibits no distension. There is no tenderness. There is no rebound and no guarding.  Musculoskeletal: He exhibits no edema.  Vitals reviewed.  Wt Readings from Last 3 Encounters:  04/07/16 (!) 398 lb (180.5 kg)  09/13/15 (!) 396 lb (179.6 kg)  12/28/14 (!) 388 lb (176 kg)          Assessment & Plan:  High risk medication use - Plan: CBC with Differential/Platelet, COMPLETE METABOLIC PANEL WITH GFR, Lipid panel  Morbid obesity due to excess calories (HCC)  Essential hypertension - Plan: CBC with Differential/Platelet, COMPLETE METABOLIC PANEL WITH GFR, Lipid panel  I strongly encouraged the patient quit smoking. His blood pressures borderline today at best. I recommended that he start checking his blood pressure everyday at home and provide the values to me in one week to review. His goal blood pressure will be less than 140/90. He is got to start losing weight. In no uncertain  terms I explained to the patient that he is cutting his life short with his weight. He must start exercising. I recommended at least 30 minutes of aerobic exercise 5 days a week. I recommended restricting calories to less than 1500 cal a day. I recommended a vegetarian diet. Recheck in 6 months or as needed

## 2016-07-08 ENCOUNTER — Other Ambulatory Visit: Payer: Self-pay | Admitting: Family Medicine

## 2016-08-03 ENCOUNTER — Other Ambulatory Visit: Payer: Self-pay | Admitting: Family Medicine

## 2016-10-06 ENCOUNTER — Ambulatory Visit: Payer: Self-pay | Admitting: Family Medicine

## 2016-10-11 ENCOUNTER — Other Ambulatory Visit: Payer: Self-pay | Admitting: Family Medicine

## 2016-10-16 ENCOUNTER — Other Ambulatory Visit: Payer: Self-pay | Admitting: Family Medicine

## 2016-11-06 ENCOUNTER — Other Ambulatory Visit: Payer: Self-pay | Admitting: Family Medicine

## 2016-12-04 ENCOUNTER — Encounter: Payer: Self-pay | Admitting: Family Medicine

## 2017-01-09 ENCOUNTER — Encounter: Payer: Self-pay | Admitting: Family Medicine

## 2017-01-24 ENCOUNTER — Other Ambulatory Visit: Payer: Self-pay | Admitting: Family Medicine

## 2017-02-20 ENCOUNTER — Encounter: Payer: Self-pay | Admitting: Family Medicine

## 2017-03-29 DIAGNOSIS — J4520 Mild intermittent asthma, uncomplicated: Secondary | ICD-10-CM | POA: Diagnosis not present

## 2017-03-29 DIAGNOSIS — G4733 Obstructive sleep apnea (adult) (pediatric): Secondary | ICD-10-CM | POA: Diagnosis not present

## 2017-03-29 DIAGNOSIS — M899 Disorder of bone, unspecified: Secondary | ICD-10-CM | POA: Diagnosis not present

## 2017-03-29 DIAGNOSIS — I10 Essential (primary) hypertension: Secondary | ICD-10-CM | POA: Diagnosis not present

## 2017-03-29 DIAGNOSIS — R269 Unspecified abnormalities of gait and mobility: Secondary | ICD-10-CM | POA: Diagnosis not present

## 2017-03-29 DIAGNOSIS — R799 Abnormal finding of blood chemistry, unspecified: Secondary | ICD-10-CM | POA: Diagnosis not present

## 2017-03-29 DIAGNOSIS — Z6841 Body Mass Index (BMI) 40.0 and over, adult: Secondary | ICD-10-CM | POA: Diagnosis not present

## 2017-03-29 DIAGNOSIS — F952 Tourette's disorder: Secondary | ICD-10-CM | POA: Diagnosis not present

## 2017-03-29 DIAGNOSIS — F17210 Nicotine dependence, cigarettes, uncomplicated: Secondary | ICD-10-CM | POA: Diagnosis not present

## 2017-03-31 DIAGNOSIS — G4733 Obstructive sleep apnea (adult) (pediatric): Secondary | ICD-10-CM | POA: Diagnosis not present

## 2017-04-12 DIAGNOSIS — F17210 Nicotine dependence, cigarettes, uncomplicated: Secondary | ICD-10-CM | POA: Diagnosis not present

## 2017-04-12 DIAGNOSIS — G4733 Obstructive sleep apnea (adult) (pediatric): Secondary | ICD-10-CM | POA: Diagnosis not present

## 2017-04-12 DIAGNOSIS — J4520 Mild intermittent asthma, uncomplicated: Secondary | ICD-10-CM | POA: Diagnosis not present

## 2017-04-12 DIAGNOSIS — I10 Essential (primary) hypertension: Secondary | ICD-10-CM | POA: Diagnosis not present

## 2017-05-25 ENCOUNTER — Telehealth: Payer: Self-pay | Admitting: Family Medicine

## 2017-05-25 DIAGNOSIS — I10 Essential (primary) hypertension: Secondary | ICD-10-CM | POA: Diagnosis not present

## 2017-05-25 DIAGNOSIS — Z6841 Body Mass Index (BMI) 40.0 and over, adult: Secondary | ICD-10-CM | POA: Diagnosis not present

## 2017-05-25 DIAGNOSIS — G4733 Obstructive sleep apnea (adult) (pediatric): Secondary | ICD-10-CM | POA: Diagnosis not present

## 2017-05-25 DIAGNOSIS — R7303 Prediabetes: Secondary | ICD-10-CM | POA: Diagnosis not present

## 2017-05-25 DIAGNOSIS — F952 Tourette's disorder: Secondary | ICD-10-CM | POA: Diagnosis not present

## 2017-05-25 DIAGNOSIS — Z131 Encounter for screening for diabetes mellitus: Secondary | ICD-10-CM | POA: Diagnosis not present

## 2017-05-25 MED ORDER — RISPERIDONE 2 MG PO TABS: 2.0000 mg | ORAL_TABLET | Freq: Three times a day (TID) | ORAL | 1 refills | Status: DC

## 2017-05-25 NOTE — Telephone Encounter (Signed)
Medication called/sent to requested pharmacy  

## 2017-05-25 NOTE — Telephone Encounter (Signed)
Patient calling to get [facility] he has 1 day left of this

## 2017-06-06 DIAGNOSIS — G4733 Obstructive sleep apnea (adult) (pediatric): Secondary | ICD-10-CM | POA: Diagnosis not present

## 2017-06-07 DIAGNOSIS — Z9989 Dependence on other enabling machines and devices: Secondary | ICD-10-CM | POA: Diagnosis not present

## 2017-06-07 DIAGNOSIS — J4520 Mild intermittent asthma, uncomplicated: Secondary | ICD-10-CM | POA: Diagnosis not present

## 2017-06-07 DIAGNOSIS — F17210 Nicotine dependence, cigarettes, uncomplicated: Secondary | ICD-10-CM | POA: Diagnosis not present

## 2017-06-07 DIAGNOSIS — G4733 Obstructive sleep apnea (adult) (pediatric): Secondary | ICD-10-CM | POA: Diagnosis not present

## 2017-06-07 DIAGNOSIS — Z23 Encounter for immunization: Secondary | ICD-10-CM | POA: Diagnosis not present

## 2017-06-13 DIAGNOSIS — G4733 Obstructive sleep apnea (adult) (pediatric): Secondary | ICD-10-CM | POA: Diagnosis not present

## 2017-06-26 DIAGNOSIS — G4733 Obstructive sleep apnea (adult) (pediatric): Secondary | ICD-10-CM | POA: Diagnosis not present

## 2017-07-08 ENCOUNTER — Other Ambulatory Visit: Payer: Self-pay | Admitting: Family Medicine

## 2017-07-26 DIAGNOSIS — G4733 Obstructive sleep apnea (adult) (pediatric): Secondary | ICD-10-CM | POA: Diagnosis not present

## 2017-07-30 ENCOUNTER — Other Ambulatory Visit: Payer: Self-pay | Admitting: Family Medicine

## 2017-07-30 MED ORDER — POTASSIUM CHLORIDE CRYS ER 20 MEQ PO TBCR: 20.0000 meq | EXTENDED_RELEASE_TABLET | Freq: Every day | ORAL | 0 refills | Status: DC

## 2017-08-02 ENCOUNTER — Other Ambulatory Visit: Payer: Self-pay | Admitting: Family Medicine

## 2017-08-09 ENCOUNTER — Other Ambulatory Visit: Payer: Self-pay | Admitting: Family Medicine

## 2017-08-14 ENCOUNTER — Encounter: Payer: Self-pay | Admitting: Family Medicine

## 2017-08-26 DIAGNOSIS — G4733 Obstructive sleep apnea (adult) (pediatric): Secondary | ICD-10-CM | POA: Diagnosis not present

## 2017-08-29 ENCOUNTER — Other Ambulatory Visit: Payer: Self-pay | Admitting: Family Medicine

## 2017-09-07 ENCOUNTER — Other Ambulatory Visit: Payer: Self-pay | Admitting: Family Medicine

## 2017-09-11 ENCOUNTER — Other Ambulatory Visit: Payer: Self-pay | Admitting: Family Medicine

## 2017-09-14 DIAGNOSIS — G4733 Obstructive sleep apnea (adult) (pediatric): Secondary | ICD-10-CM | POA: Diagnosis not present

## 2017-09-26 DIAGNOSIS — G4733 Obstructive sleep apnea (adult) (pediatric): Secondary | ICD-10-CM | POA: Diagnosis not present

## 2017-09-28 ENCOUNTER — Other Ambulatory Visit: Payer: Self-pay | Admitting: Family Medicine

## 2017-10-08 ENCOUNTER — Other Ambulatory Visit: Payer: Self-pay | Admitting: Family Medicine

## 2017-10-08 MED ORDER — RISPERIDONE 2 MG PO TABS: ORAL_TABLET | ORAL | 0 refills | Status: DC

## 2017-10-08 NOTE — Telephone Encounter (Signed)
Risperidone sent to pharm for 30 days supply until apt is kept then will do 90 day supply.

## 2017-10-12 ENCOUNTER — Ambulatory Visit (INDEPENDENT_AMBULATORY_CARE_PROVIDER_SITE_OTHER): Payer: Medicare Other | Admitting: Family Medicine

## 2017-10-12 ENCOUNTER — Encounter: Payer: Self-pay | Admitting: Family Medicine

## 2017-10-12 VITALS — BP 134/72 | HR 86 | Temp 98.2°F | Resp 16 | Ht 73.0 in | Wt >= 6400 oz

## 2017-10-12 DIAGNOSIS — I10 Essential (primary) hypertension: Secondary | ICD-10-CM | POA: Diagnosis not present

## 2017-10-12 DIAGNOSIS — F952 Tourette's disorder: Secondary | ICD-10-CM

## 2017-10-12 DIAGNOSIS — Z79899 Other long term (current) drug therapy: Secondary | ICD-10-CM | POA: Diagnosis not present

## 2017-10-12 LAB — LIPID PANEL
Cholesterol: 162 mg/dL (ref <200)
HDL: 45 mg/dL (ref >40)
LDL Cholesterol (Calc): 100 mg/dL (calc) — ABNORMAL HIGH
NON-HDL CHOLESTEROL (CALC): 117 mg/dL (ref <130)
Total CHOL/HDL Ratio: 3.6 (calc) (ref <5.0)
Triglycerides: 77 mg/dL (ref <150)

## 2017-10-12 LAB — CBC WITH DIFFERENTIAL/PLATELET
Basophils Absolute: 30 cells/uL (ref 0–200)
Basophils Relative: 0.6 %
EOS ABS: 160 {cells}/uL (ref 15–500)
Eosinophils Relative: 3.2 %
HEMATOCRIT: 40 % (ref 38.5–50.0)
HEMOGLOBIN: 13.5 g/dL (ref 13.2–17.1)
LYMPHS ABS: 1590 {cells}/uL (ref 850–3900)
MCH: 27.9 pg (ref 27.0–33.0)
MCHC: 33.8 g/dL (ref 32.0–36.0)
MCV: 82.6 fL (ref 80.0–100.0)
MPV: 11.9 fL (ref 7.5–12.5)
Monocytes Relative: 9.1 %
NEUTROS ABS: 2765 {cells}/uL (ref 1500–7800)
Neutrophils Relative %: 55.3 %
PLATELETS: 200 10*3/uL (ref 140–400)
RBC: 4.84 10*6/uL (ref 4.20–5.80)
RDW: 13 % (ref 11.0–15.0)
TOTAL LYMPHOCYTE: 31.8 %
WBC mixed population: 455 cells/uL (ref 200–950)
WBC: 5 10*3/uL (ref 3.8–10.8)

## 2017-10-12 LAB — COMPLETE METABOLIC PANEL WITH GFR
AG RATIO: 1.4 (calc) (ref 1.0–2.5)
ALBUMIN MSPROF: 4 g/dL (ref 3.6–5.1)
ALT: 31 U/L (ref 9–46)
AST: 20 U/L (ref 10–40)
Alkaline phosphatase (APISO): 125 U/L — ABNORMAL HIGH (ref 40–115)
BILIRUBIN TOTAL: 0.7 mg/dL (ref 0.2–1.2)
BUN: 11 mg/dL (ref 7–25)
CALCIUM: 9.1 mg/dL (ref 8.6–10.3)
CHLORIDE: 101 mmol/L (ref 98–110)
CO2: 31 mmol/L (ref 20–32)
Creat: 1.11 mg/dL (ref 0.60–1.35)
GFR, EST AFRICAN AMERICAN: 94 mL/min/{1.73_m2} (ref >=60)
GFR, EST NON AFRICAN AMERICAN: 81 mL/min/{1.73_m2} (ref >=60)
Globulin: 2.9 g/dL (calc) (ref 1.9–3.7)
Glucose, Bld: 92 mg/dL (ref 65–99)
POTASSIUM: 4 mmol/L (ref 3.5–5.3)
Sodium: 139 mmol/L (ref 135–146)
TOTAL PROTEIN: 6.9 g/dL (ref 6.1–8.1)

## 2017-10-12 NOTE — Progress Notes (Signed)
Subjective:    Patient ID: Jimmy Miller, male    DOB: [birth year], 42 y.o.   MRN: [id number]  HPI  09/13/15 Patient is a very pleasant 42 year old African-American man who is here today for medicine check. Unfortunately he has gained another 8 additional pounds since when I last saw him last. He is not exercising.  He is not watching his diet. He denies any symptoms of sleep apnea. He states he was checked for sleep apnea in 2008 and his sleep study was normal. His blood pressure today is well controlled at 138/84. He denies any chest pain shortness of breath or dyspnea on exertion. He is due for fasting lab work to help monitor for hyperlipidemia given his obesity as well as his high risk medication respiratory. He is currently on Risperdal for Tourette syndrome. Patient states that without the Risperdal his Tourette's syndrome will worsen dramatically. The medication helps control his vocal tics/outburst. He is willing to accept the fact that medication is contributing some to his weight gain. At that time, my plan was: Patient's blood pressure is well controlled. Patient satisfied with the control he has of his Tourette's at the present time. He does not want to change any of his medication. I will check a CBC, CMP, and a fasting lipid panel. I recommended 1500 cal a day total. I recommended 30 minutes to an hour a day of vigorous aerobic exercise. Recheck in 6 months.  We also discussed gastric bypass but he is not interested at present.    04/07/16 Apparently, the patient never returned for fasting labwork as the last labs I have are from 09/2014.  Patient's blood pressure is up significantly and he continues to gain weight.  Unfortunately he is also smoking. I will allow the patient to sit in the exam room and then I rechecked his blood pressure. I got his blood pressure 138/82 which was better. The patient is not checking his blood pressure home. He is also not exercising. He is not adhering to any  specific diet. He denies any chest pain shortness of breath or dyspnea on exertion. He denies any trouble breathing. He denies any hypersomnia, loud snoring, or apneic episodes.  At that time, my plan was: I strongly encouraged the patient quit smoking. His blood pressures borderline today at best. I recommended that he start checking his blood pressure everyday at home and provide the values to me in one week to review. His goal blood pressure will be less than 140/90. He is got to start losing weight. In no uncertain terms I explained to the patient that he is cutting his life short with his weight. He must start exercising. I recommended at least 30 minutes of aerobic exercise 5 days a week. I recommended restricting calories to less than 1500 cal a day. I recommended a vegetarian diet. Recheck in 6 months or as needed  10/12/17 Since I last saw the patient, he has gained 28 pounds. He is not following any specific diet. He is not exercising. He is compliant with his CPAP. His blood pressure today is adequately controlled at 134/72. He denies any chest pain shortness of breath or dyspnea on exertion. He denies any polyuria, polydipsia, or blurry vision. He unfortunately continues to smoke. He is continuing to use Risperdal for Tourette's disorder. He is unable to tolerate his condition without the medication. He is aware that the medication can cause weight gain. Past Medical History:  Diagnosis Date  . Asthma   .  Colon polyp   . HTN (hypertension)   . Obesity, morbid, BMI 50 or higher (HCC)   . Tobacco abuse   . Tourette disorder   . Ventral hernia    Past Surgical History:  Procedure Laterality Date  . ABDOMINAL HERNIA REPAIR     as a baby  . COLONOSCOPY  2011   [facility]: 9mm tubulovillous adenoma removed from rectum  . COLONOSCOPY  2008   [facility]: ICV erythematous (bx lymphoid aggregate but no active inflammation or granulomas). TRV colon polyp 9mm (features of juvenile  polyp, focal non-caseating granuloma)  . COLONOSCOPY N/A 12/11/2012   Procedure: COLONOSCOPY;  Surgeon: Corbin Adeobert M Rourk, MD;  Location: [facility];  Service: Endoscopy;  Laterality: N/A;  12:30   Current Outpatient Medications on File Prior to Visit  Medication Sig Dispense Refill  . ADVAIR DISKUS 250-50 MCG/DOSE AEPB INHALE 1 PUFF INTO LUNGS TWO TIMES DAILY 180 each 4  . albuterol (PROVENTIL HFA;VENTOLIN HFA) 108 (90 BASE) MCG/ACT inhaler Inhale 2 puffs into the lungs every 6 (six) hours as needed for wheezing or shortness of breath. 1 Inhaler 3  . amLODipine (NORVASC) 10 MG tablet TAKE 1 TABLET BY MOUTH DAILY FOR BLOOD PRESSURE 90 tablet 3  . hydrochlorothiazide (HYDRODIURIL) 25 MG tablet TAKE 1 TABLET BY MOUTH DAILY 90 tablet 3  . ibuprofen (ADVIL,MOTRIN) 800 MG tablet Take 1 tablet (800 mg total) by mouth every 8 (eight) hours as needed. 90 tablet 0  . KLOR-CON M20 20 MEQ tablet TAKE 1 TABLET BY MOUTH DAILY 90 tablet 0  . potassium chloride SA (KLOR-CON M20) 20 MEQ tablet Take 1 tablet (20 mEq total) by mouth daily. 90 tablet 0  . risperiDONE (RISPERDAL) 2 MG tablet TAKE 1 TABLET (2 MG TOTAL) BY MOUTH 3 TIMES DAILY. 30 tablet 0   No current facility-administered medications on file prior to visit.    Allergies  Allergen Reactions  . Benazepril Swelling    Mouth swelling   Social History   Socioeconomic History  . Marital status: Legally Separated    Spouse name: Not on file  . Number of children: 0  . Years of education: Not on file  . Highest education level: Not on file  Social Needs  . Financial resource strain: Not on file  . Food insecurity - worry: Not on file  . Food insecurity - inability: Not on file  . Transportation needs - medical: Not on file  . Transportation needs - non-medical: Not on file  Occupational History  . Occupation: disability    Employer: [employer]  Tobacco Use  . Smoking status: Former Smoker    Packs/day: 0.50    Types: Cigarettes  .  Smokeless tobacco: Former NeurosurgeonUser    Quit date: 01/05/2013  Substance and Sexual Activity  . Alcohol use: No  . Drug use: No  . Sexual activity: Not on file  Other Topics Concern  . Not on file  Social History Narrative  . Not on file      Review of Systems  All other systems reviewed and are negative.      Objective:   Physical Exam  Constitutional: He appears well-developed and well-nourished.  Cardiovascular: Normal rate, regular rhythm and normal heart sounds.  No murmur heard. Pulmonary/Chest: Effort normal and breath sounds normal. No respiratory distress. He has no wheezes. He has no rales.  Abdominal: Soft. Bowel sounds are normal. He exhibits no distension. There is no tenderness. There is no rebound and  no guarding.  Musculoskeletal: He exhibits no edema.  Vitals reviewed.  Wt Readings from Last 3 Encounters:  10/12/17 (!) 426 lb (193.2 kg)  04/07/16 (!) 398 lb (180.5 kg)  09/13/15 (!) 396 lb (179.6 kg)          Assessment & Plan:  High risk medication use  Morbid obesity due to excess calories (HCC) - Plan: CBC with Differential/Platelet, COMPLETE METABOLIC PANEL WITH GFR, Lipid panel  Essential hypertension  Tourette's disorder  Blood pressure today is adequately controlled at 134/72. I will check a fasting lipid panel. Goal LDL cholesterol is less than 130. I will also check a fasting blood sugar. If fasting blood sugar is elevated, I will add a hemoglobin A1c. Spent more than 20 minutes with the patient discussing diet exercise and weight loss. Recommended starting out with 5 minutes a day and increasing gradually to 30 minutes a day 5 days a week of aerobic exercise. 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 Also discussed saxenda but patient declines any medication for weight loss. Encouraged smoking cessation

## 2017-10-16 ENCOUNTER — Encounter: Payer: Self-pay | Admitting: Family Medicine

## 2017-10-21 ENCOUNTER — Other Ambulatory Visit: Payer: Self-pay | Admitting: Family Medicine

## 2017-10-24 DIAGNOSIS — G4733 Obstructive sleep apnea (adult) (pediatric): Secondary | ICD-10-CM | POA: Diagnosis not present

## 2017-10-29 ENCOUNTER — Other Ambulatory Visit: Payer: Self-pay | Admitting: Family Medicine

## 2017-10-29 MED ORDER — HYDROCHLOROTHIAZIDE 25 MG PO TABS: 25.0000 mg | ORAL_TABLET | Freq: Every day | ORAL | 3 refills | Status: DC

## 2017-10-29 MED ORDER — AMLODIPINE BESYLATE 10 MG PO TABS: 10.0000 mg | ORAL_TABLET | Freq: Every day | ORAL | 3 refills | Status: DC

## 2017-10-29 MED ORDER — POTASSIUM CHLORIDE CRYS ER 20 MEQ PO TBCR: 20.0000 meq | EXTENDED_RELEASE_TABLET | Freq: Every day | ORAL | 3 refills | Status: DC

## 2017-10-29 MED ORDER — FLUTICASONE-SALMETEROL 250-50 MCG/DOSE IN AEPB: INHALATION_SPRAY | RESPIRATORY_TRACT | 4 refills | Status: DC

## 2017-11-01 ENCOUNTER — Other Ambulatory Visit: Payer: Self-pay | Admitting: Family Medicine

## 2017-11-07 ENCOUNTER — Other Ambulatory Visit: Payer: Self-pay

## 2017-11-07 ENCOUNTER — Emergency Department (HOSPITAL_COMMUNITY)
Admission: EM | Admit: 2017-11-07 | Discharge: 2017-11-07 | Disposition: A | Discharge status: Home / Self Care | Payer: Medicare Other | Attending: Emergency Medicine | Admitting: Emergency Medicine

## 2017-11-07 ENCOUNTER — Encounter: Payer: Self-pay | Admitting: Internal Medicine

## 2017-11-07 ENCOUNTER — Encounter (HOSPITAL_COMMUNITY): Payer: Self-pay | Admitting: Emergency Medicine

## 2017-11-07 DIAGNOSIS — I10 Essential (primary) hypertension: Secondary | ICD-10-CM | POA: Insufficient documentation

## 2017-11-07 DIAGNOSIS — Z87891 Personal history of nicotine dependence: Secondary | ICD-10-CM | POA: Insufficient documentation

## 2017-11-07 DIAGNOSIS — Y999 Unspecified external cause status: Secondary | ICD-10-CM | POA: Diagnosis not present

## 2017-11-07 DIAGNOSIS — Y9389 Activity, other specified: Secondary | ICD-10-CM | POA: Insufficient documentation

## 2017-11-07 DIAGNOSIS — S3992XA Unspecified injury of lower back, initial encounter: Secondary | ICD-10-CM | POA: Diagnosis present

## 2017-11-07 DIAGNOSIS — J45909 Unspecified asthma, uncomplicated: Secondary | ICD-10-CM | POA: Insufficient documentation

## 2017-11-07 DIAGNOSIS — Y92410 Unspecified street and highway as the place of occurrence of the external cause: Secondary | ICD-10-CM | POA: Insufficient documentation

## 2017-11-07 DIAGNOSIS — Z79899 Other long term (current) drug therapy: Secondary | ICD-10-CM | POA: Diagnosis not present

## 2017-11-07 DIAGNOSIS — S39012A Strain of muscle, fascia and tendon of lower back, initial encounter: Secondary | ICD-10-CM

## 2017-11-07 MED ORDER — CYCLOBENZAPRINE HCL 10 MG PO TABS: 10.0000 mg | ORAL_TABLET | Freq: Three times a day (TID) | ORAL | 0 refills | Status: DC

## 2017-11-07 MED ORDER — IBUPROFEN 600 MG PO TABS: 600.0000 mg | ORAL_TABLET | Freq: Four times a day (QID) | ORAL | 0 refills | Status: AC
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
## 2017-11-08 ENCOUNTER — Other Ambulatory Visit: Payer: Self-pay

## 2017-11-09 ENCOUNTER — Ambulatory Visit (INDEPENDENT_AMBULATORY_CARE_PROVIDER_SITE_OTHER): Payer: Medicare Other | Admitting: Family Medicine

## 2017-11-09 ENCOUNTER — Encounter: Payer: Self-pay | Admitting: Family Medicine

## 2017-11-09 VITALS — BP 144/76 | HR 90 | Temp 98.2°F | Resp 20 | Ht 73.0 in | Wt >= 6400 oz

## 2017-11-09 DIAGNOSIS — M545 Low back pain, unspecified: Secondary | ICD-10-CM
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
## 2017-11-15 ENCOUNTER — Telehealth: Payer: Self-pay | Admitting: *Deleted

## 2017-11-15 NOTE — Telephone Encounter (Signed)
Received call from patient.   Reports that he would like to change medication for Tourettes due to weight gain. States that he has discussed changing Risperdal with PCP previously.   MD please advise.

## 2017-11-16 NOTE — Telephone Encounter (Signed)
There is no medicine that treats tourettes that does not cause weight gain that I am aware of.  He could try stopping hte medication or we could replace with abilify 5 mg a day which causes less weight gain

## 2017-11-16 NOTE — Telephone Encounter (Signed)
Call placed to patient and patient made aware.   States that since all meds can cause weight gain, he would prefer to remain on Risperdal as he knows that it works.   MD to be made aware.

## 2017-11-24 DIAGNOSIS — G4733 Obstructive sleep apnea (adult) (pediatric): Secondary | ICD-10-CM | POA: Diagnosis not present

## 2017-12-24 DIAGNOSIS — G4733 Obstructive sleep apnea (adult) (pediatric): Secondary | ICD-10-CM | POA: Diagnosis not present

## 2018-01-14 DIAGNOSIS — G4733 Obstructive sleep apnea (adult) (pediatric): Secondary | ICD-10-CM | POA: Diagnosis not present

## 2018-01-24 DIAGNOSIS — G4733 Obstructive sleep apnea (adult) (pediatric): Secondary | ICD-10-CM | POA: Diagnosis not present

## 2018-03-11 ENCOUNTER — Other Ambulatory Visit: Payer: Self-pay | Admitting: Family Medicine

## 2018-04-15 ENCOUNTER — Ambulatory Visit: Payer: Medicare Other | Admitting: Family Medicine

## 2018-04-24 ENCOUNTER — Other Ambulatory Visit: Payer: Self-pay | Admitting: Family Medicine

## 2018-04-24 MED ORDER — FLUTICASONE-SALMETEROL 250-50 MCG/DOSE IN AEPB: INHALATION_SPRAY | RESPIRATORY_TRACT | 4 refills | Status: DC

## 2018-04-24 MED ORDER — HYDROCHLOROTHIAZIDE 25 MG PO TABS: 25.0000 mg | ORAL_TABLET | Freq: Every day | ORAL | 3 refills | Status: DC

## 2018-04-24 MED ORDER — RISPERIDONE 2 MG PO TABS: ORAL_TABLET | ORAL | 2 refills | Status: DC

## 2018-04-24 MED ORDER — POTASSIUM CHLORIDE CRYS ER 20 MEQ PO TBCR: 20.0000 meq | EXTENDED_RELEASE_TABLET | Freq: Every day | ORAL | 3 refills | Status: DC

## 2018-04-24 MED ORDER — AMLODIPINE BESYLATE 10 MG PO TABS: 10.0000 mg | ORAL_TABLET | Freq: Every day | ORAL | 3 refills | Status: DC

## 2018-04-30 DIAGNOSIS — G4733 Obstructive sleep apnea (adult) (pediatric): Secondary | ICD-10-CM | POA: Diagnosis not present

## 2018-07-24 ENCOUNTER — Emergency Department (HOSPITAL_COMMUNITY)
Admission: EM | Admit: 2018-07-24 | Discharge: 2018-07-24 | Disposition: A | Discharge status: Home / Self Care | Payer: Medicare Other | Attending: Emergency Medicine | Admitting: Emergency Medicine

## 2018-07-24 ENCOUNTER — Other Ambulatory Visit: Payer: Self-pay

## 2018-07-24 ENCOUNTER — Encounter (HOSPITAL_COMMUNITY): Payer: Self-pay | Admitting: Emergency Medicine

## 2018-07-24 DIAGNOSIS — I10 Essential (primary) hypertension: Secondary | ICD-10-CM | POA: Diagnosis not present

## 2018-07-24 DIAGNOSIS — K047 Periapical abscess without sinus: Secondary | ICD-10-CM | POA: Insufficient documentation

## 2018-07-24 DIAGNOSIS — K0889 Other specified disorders of teeth and supporting structures: Secondary | ICD-10-CM | POA: Diagnosis present

## 2018-07-24 DIAGNOSIS — Z87891 Personal history of nicotine dependence: Secondary | ICD-10-CM | POA: Insufficient documentation

## 2018-07-24 DIAGNOSIS — Z79899 Other long term (current) drug therapy: Secondary | ICD-10-CM | POA: Insufficient documentation

## 2018-07-24 DIAGNOSIS — J45909 Unspecified asthma, uncomplicated: Secondary | ICD-10-CM | POA: Insufficient documentation

## 2018-07-24 MED ORDER — KETOROLAC TROMETHAMINE 60 MG/2ML IM SOLN
60.0000 mg | Freq: Once | INTRAMUSCULAR | Status: AC
  Administered 2018-07-24: 60 mg via INTRAMUSCULAR
  Filled 2018-07-24: qty 2

## 2018-07-24 MED ORDER — PENICILLIN V POTASSIUM 500 MG PO TABS: 500.0000 mg | ORAL_TABLET | Freq: Three times a day (TID) | ORAL | 0 refills | Status: AC

## 2018-07-24 NOTE — ED Triage Notes (Signed)
Pt c/o left sided upper and lower dental pain x 1 week, has not seen a dentist, has taken Advil at home with no relief

## 2018-07-24 NOTE — ED Provider Notes (Signed)
[facility] EMERGENCY DEPARTMENT Provider Note   CSN: [id number] Arrival date & time: 07/24/18  2841     History   Chief Complaint Chief Complaint  Patient presents with  . Dental Pain    HPI Jimmy Miller is a 42 y.o. male.  HPI Patient presents with left upper and left lower molar pain for the past week.  He has had some left-sided facial swelling over the last day.  No fever or chills.  Concerned he may have a dental infection.  Has not seen a dentist.  No known trauma to the teeth.   Past Medical History:  Diagnosis Date  . Asthma   . Colon polyp   . HTN (hypertension)   . Obesity, morbid, BMI 50 or higher (HCC)   . Tobacco abuse   . Tourette disorder   . Ventral hernia     Patient Active Problem List   Diagnosis Date Noted  . Personal history of colonic polyps 11/13/2012  . Abnormal LFTs 11/13/2012  . HYPERTENSION 07/18/2007  . MORBID OBESITY 06/17/2007  . TOURETTE'S DISORDER 06/17/2007  . ASTHMA 06/17/2007    Past Surgical History:  Procedure Laterality Date  . ABDOMINAL HERNIA REPAIR     as a baby  . COLONOSCOPY  2011   [facility]: 9mm tubulovillous adenoma removed from rectum  . COLONOSCOPY  2008   [facility]: ICV erythematous (bx lymphoid aggregate but no active inflammation or granulomas). TRV colon polyp 9mm (features of juvenile polyp, focal non-caseating granuloma)  . COLONOSCOPY N/A 12/11/2012   Procedure: COLONOSCOPY;  Surgeon: Corbin Ade, MD;  Location: [facility];  Service: Endoscopy;  Laterality: N/A;  12:30        Home Medications    Prior to Admission medications   Medication Sig Start Date End Date Taking? Authorizing Provider  albuterol (PROVENTIL HFA;VENTOLIN HFA) 108 (90 BASE) MCG/ACT inhaler Inhale 2 puffs into the lungs every 6 (six) hours as needed for wheezing or shortness of breath. 07/05/15   Donita Brooks, MD  amLODipine (NORVASC) 10 MG tablet Take 1 tablet (10 mg total) by mouth daily. for blood  pressure 04/24/18   Donita Brooks, MD  cyclobenzaprine (FLEXERIL) 10 MG tablet Take 1 tablet (10 mg total) by mouth 3 (three) times daily. 11/07/17   Ivery Quale, PA-C  Fluticasone-Salmeterol (ADVAIR DISKUS) 250-50 MCG/DOSE AEPB INHALE 1 PUFF INTO LUNGS TWO TIMES DAILY 04/24/18   Donita Brooks, MD  hydrochlorothiazide (HYDRODIURIL) 25 MG tablet Take 1 tablet (25 mg total) by mouth daily. 04/24/18   Donita Brooks, MD  ibuprofen (ADVIL,MOTRIN) 600 MG tablet Take 1 tablet (600 mg total) by mouth 4 (four) times daily. 11/07/17   Ivery Quale, PA-C  KLOR-CON M20 20 MEQ tablet TAKE 1 TABLET BY MOUTH DAILY 08/02/17   Donita Brooks, MD  penicillin v potassium (VEETID) 500 MG tablet Take 1 tablet (500 mg total) by mouth 3 (three) times daily for 7 days. 07/24/18 07/31/18  Loren Racer, MD  potassium chloride SA (KLOR-CON M20) 20 MEQ tablet Take 1 tablet (20 mEq total) by mouth daily. 04/24/18   Donita Brooks, MD  risperiDONE (RISPERDAL) 2 MG tablet TAKE 1 TABLET (2 MG TOTAL) BY MOUTH 3 TIMES DAILY 04/24/18   Donita Brooks, MD    Family History Family History  Problem Relation Age of Onset  . Cancer Maternal Grandfather   . Colon cancer Neg Hx   . Liver disease Neg Hx  Social History Social History   Tobacco Use  . Smoking status: Former Smoker    Packs/day: 0.50    Types: Cigarettes  . Smokeless tobacco: Former NeurosurgeonUser    Quit date: 01/05/2013  Substance Use Topics  . Alcohol use: No  . Drug use: No     Allergies   Benazepril   Review of Systems Review of Systems  Constitutional: Negative for chills and fever.  HENT: Positive for dental problem and facial swelling. Negative for sore throat, trouble swallowing and voice change.   Respiratory: Negative for shortness of breath.   Skin: Negative for rash.  Neurological: Negative for headaches.  All other systems reviewed and are negative.    Physical Exam Updated Vital Signs BP 136/78 (BP Location: Right  Arm)   Pulse 85   Temp 98 F (36.7 C) (Oral)   Resp 17   Ht 6' (1.829 m)   Wt (!) 165.7 kg   SpO2 99%   BMI 49.55 kg/m   Physical Exam Vitals signs and nursing note reviewed.  Constitutional:      Appearance: Normal appearance. He is well-developed.  HENT:     Head: Normocephalic and atraumatic.     Nose: Nose normal.     Mouth/Throat:     Mouth: Mucous membranes are moist.     Comments: Impacted left upper and lower third molars.  Patient does have scattered caries.  No obvious fluctuant abscess. Eyes:     Pupils: Pupils are equal, round, and reactive to light.  Neck:     Musculoskeletal: Normal range of motion and neck supple.  Cardiovascular:     Rate and Rhythm: Normal rate and regular rhythm.  Pulmonary:     Effort: Pulmonary effort is normal.     Breath sounds: Normal breath sounds.  Abdominal:     General: Bowel sounds are normal.     Palpations: Abdomen is soft.     Tenderness: There is no abdominal tenderness. There is no guarding or rebound.  Musculoskeletal: Normal range of motion.        General: No tenderness.  Skin:    General: Skin is warm and dry.     Findings: No erythema or rash.  Neurological:     Mental Status: He is alert and oriented to person, place, and time.  Psychiatric:        Behavior: Behavior normal.      ED Treatments / Results  Labs (all labs ordered are listed, but only abnormal results are displayed) Labs Reviewed - No data to display  EKG None  Radiology No results found.  Procedures Procedures (including critical care time)  Medications Ordered in ED Medications  ketorolac (TORADOL) injection 60 mg (60 mg Intramuscular Given 07/24/18 1048)     Initial Impression / Assessment and Plan / ED Course  I have reviewed the triage vital signs and the nursing notes.  Pertinent labs & imaging results that were available during my care of the patient were reviewed by me and considered in my medical decision making (see  chart for details).     Dental infection versus wisdom tooth impaction.  Will start on antibiotics and advise close follow-up with a dentist.  Return precautions given.  Final Clinical Impressions(s) / ED Diagnoses   Final diagnoses:  Dental infection    ED Discharge Orders         Ordered    penicillin v potassium (VEETID) 500 MG tablet  3 times daily  07/24/18 1102           Loren Racer, MD 07/24/18 1103

## 2018-10-15 DIAGNOSIS — J069 Acute upper respiratory infection, unspecified: Secondary | ICD-10-CM | POA: Diagnosis not present

## 2018-10-19 DIAGNOSIS — M545 Low back pain: Secondary | ICD-10-CM | POA: Diagnosis not present

## 2018-10-22 ENCOUNTER — Encounter: Payer: Medicare Other | Admitting: Family Medicine

## 2018-12-10 ENCOUNTER — Encounter: Payer: Medicare Other | Admitting: Family Medicine

## 2019-01-03 ENCOUNTER — Other Ambulatory Visit: Payer: Self-pay | Admitting: Family Medicine

## 2019-01-24 ENCOUNTER — Other Ambulatory Visit: Payer: Self-pay | Admitting: Family Medicine

## 2019-01-27 ENCOUNTER — Telehealth: Payer: Self-pay | Admitting: Family Medicine

## 2019-03-26 ENCOUNTER — Other Ambulatory Visit: Payer: Self-pay | Admitting: Family Medicine

## 2019-04-04 ENCOUNTER — Other Ambulatory Visit: Payer: Self-pay | Admitting: Family Medicine

## 2019-04-25 ENCOUNTER — Ambulatory Visit (INDEPENDENT_AMBULATORY_CARE_PROVIDER_SITE_OTHER): Payer: Medicare Other | Admitting: Family Medicine

## 2019-04-25 ENCOUNTER — Encounter: Payer: Self-pay | Admitting: Family Medicine

## 2019-04-25 VITALS — BP 150/80 | HR 90 | Temp 98.6°F | Resp 18 | Ht 73.0 in | Wt >= 6400 oz

## 2019-04-25 DIAGNOSIS — Z23 Encounter for immunization: Secondary | ICD-10-CM | POA: Diagnosis not present

## 2019-04-25 DIAGNOSIS — F952 Tourette's disorder: Secondary | ICD-10-CM

## 2019-04-25 DIAGNOSIS — I10 Essential (primary) hypertension: Secondary | ICD-10-CM

## 2019-04-25 MED ORDER — ATENOLOL 50 MG PO TABS: 50.0000 mg | ORAL_TABLET | Freq: Every day | ORAL | 3 refills | Status: DC

## 2019-04-25 NOTE — Progress Notes (Signed)
Subjective:    Patient ID: Jimmy Miller, male    DOB: [birth year], 43 y.o.   MRN: [id number]  Medication Refill    Patient is here today for follow-up.  He has essential hypertension along with morbid obesity.  He has been unsuccessful in losing weight primarily he states due to COVID-19 which is kept him away from the gym.  However he plans to start exercising now that the gyms have opened back up.  His blood pressure today is elevated at 150/80.  He is also interested in meeting with a nutritionist to help with weight loss.  He has obstructive sleep apnea however he states that he does not wear his CPAP machine every night.  I took this time to discuss the risk of untreated sleep apnea including sudden cardiac death, cardiomyopathy, as well as exacerbating control of his hypertension.  I strongly encouraged the patient to wear his CPAP machine every night.  He denies any chest pain.  He denies any shortness of breath.  He denies any dyspnea on exertion.  He denies any orthopnea or paroxysmal nocturnal dyspnea Past Medical History:  Diagnosis Date  . Asthma   . Colon polyp   . HTN (hypertension)   . Obesity, morbid, BMI 50 or higher (HCC)   . Tobacco abuse   . Tourette disorder   . Ventral hernia    Past Surgical History:  Procedure Laterality Date  . ABDOMINAL HERNIA REPAIR     as a baby  . COLONOSCOPY  2011   [facility]: 9mm tubulovillous adenoma removed from rectum  . COLONOSCOPY  2008   [facility]: ICV erythematous (bx lymphoid aggregate but no active inflammation or granulomas). TRV colon polyp 9mm (features of juvenile polyp, focal non-caseating granuloma)  . COLONOSCOPY N/A 12/11/2012   Procedure: COLONOSCOPY;  Surgeon: Corbin Adeobert M Rourk, MD;  Location: [facility];  Service: Endoscopy;  Laterality: N/A;  12:30   Current Outpatient Medications on File Prior to Visit  Medication Sig Dispense Refill  . albuterol (PROVENTIL HFA;VENTOLIN HFA) 108 (90 BASE) MCG/ACT  inhaler Inhale 2 puffs into the lungs every 6 (six) hours as needed for wheezing or shortness of breath. 1 Inhaler 3  . amLODipine (NORVASC) 10 MG tablet TAKE 1 TABLET BY MOUTH  DAILY FOR BLOOD PRESSURE 90 tablet 0  . cyclobenzaprine (FLEXERIL) 10 MG tablet Take 1 tablet (10 mg total) by mouth 3 (three) times daily. 10 tablet 0  . hydrochlorothiazide (HYDRODIURIL) 25 MG tablet TAKE 1 TABLET BY MOUTH  DAILY 90 tablet 0  . ibuprofen (ADVIL,MOTRIN) 600 MG tablet Take 1 tablet (600 mg total) by mouth 4 (four) times daily. 30 tablet 0  . KLOR-CON M20 20 MEQ tablet TAKE 1 TABLET BY MOUTH DAILY 90 tablet 0  . potassium chloride SA (K-DUR) 20 MEQ tablet TAKE 1 TABLET BY MOUTH  DAILY 90 tablet 0  . risperiDONE (RISPERDAL) 2 MG tablet TAKE 1 TABLET BY MOUTH 3  TIMES DAILY 270 tablet 0  . WIXELA INHUB 250-50 MCG/DOSE AEPB INHALE 1 PUFF INTO LUNGS TWO TIMES DAILY 60 each 0   No current facility-administered medications on file prior to visit.    Allergies  Allergen Reactions  . Benazepril Swelling    Mouth swelling   Social History   Socioeconomic History  . Marital status: Married    Spouse name: Not on file  . Number of children: 0  . Years of education: Not on file  . Highest education level: Not  on file  Occupational History  . Occupation: disability    Employer: [employer]  . Financial resource strain: Not on file  . Food insecurity    Worry: Not on file    Inability: Not on file  . Transportation needs    Medical: Not on file    Non-medical: Not on file  Tobacco Use  . Smoking status: Former Smoker    Packs/day: 0.50    Types: Cigarettes  . Smokeless tobacco: Former Systems developer    Quit date: 01/05/2013  Substance and Sexual Activity  . Alcohol use: No  . Drug use: No  . Sexual activity: Not on file  Lifestyle  . Physical activity    Days per week: Not on file    Minutes per session: Not on file  . Stress: Not on file  Relationships  . Social Product manager on phone: Not on file    Gets together: Not on file    Attends religious service: Not on file    Active member of club or organization: Not on file    Attends meetings of clubs or organizations: Not on file    Relationship status: Not on file  . Intimate partner violence    Fear of current or ex partner: Not on file    Emotionally abused: Not on file    Physically abused: Not on file    Forced sexual activity: Not on file  Other Topics Concern  . Not on file  Social History Narrative  . Not on file      Review of Systems  All other systems reviewed and are negative.      Objective:   Physical Exam  Constitutional: He appears well-developed and well-nourished.  Cardiovascular: Normal rate, regular rhythm and normal heart sounds.  No murmur heard. Pulmonary/Chest: Effort normal and breath sounds normal. No respiratory distress. He has no wheezes. He has no rales.  Abdominal: Soft. Bowel sounds are normal. He exhibits no distension. There is no abdominal tenderness. There is no rebound and no guarding.  Musculoskeletal:        General: No edema.  Vitals reviewed.        Assessment & Plan:  Benign essential HTN - Plan: CBC with Differential/Platelet, COMPLETE METABOLIC PANEL WITH GFR, Lipid panel  Morbid obesity due to excess calories (HCC)  Tourette's disorder  Patient desperately needs to lose weight to reduce his long-term morbidity and mortality risk.  His blood pressure today is too high.  I recommended adding atenolol 50 mg a day to his amlodipine and hydrochlorothiazide.  He has a previous history of angioedema on benazepril so I hesitate to use an ARB.  Check CBC, CMP, fasting lipid panel.  I will schedule the patient to meet with a nutritionist to work on his diet.  Encouraged the patient to perform 30 minutes of cardiovascular exercise/aerobic exercise 5 days a week.  Patient received his flu shot today.  Strongly encouraged the patient to wear his CPAP  machine

## 2019-04-26 LAB — CBC WITH DIFFERENTIAL/PLATELET
Absolute Monocytes: 435 cells/uL (ref 200–950)
Basophils Absolute: 50 cells/uL (ref 0–200)
Basophils Relative: 1 %
Eosinophils Absolute: 160 cells/uL (ref 15–500)
Eosinophils Relative: 3.2 %
HCT: 41.8 % (ref 38.5–50.0)
Hemoglobin: 13.8 g/dL (ref 13.2–17.1)
Lymphs Abs: 1300 cells/uL (ref 850–3900)
MCH: 27.7 pg (ref 27.0–33.0)
MCHC: 33 g/dL (ref 32.0–36.0)
MCV: 83.8 fL (ref 80.0–100.0)
MPV: 12.1 fL (ref 7.5–12.5)
Monocytes Relative: 8.7 %
Neutro Abs: 3055 cells/uL (ref 1500–7800)
Neutrophils Relative %: 61.1 %
Platelets: 190 10*3/uL (ref 140–400)
RBC: 4.99 10*6/uL (ref 4.20–5.80)
RDW: 13.5 % (ref 11.0–15.0)
Total Lymphocyte: 26 %
WBC: 5 10*3/uL (ref 3.8–10.8)

## 2019-04-26 LAB — COMPLETE METABOLIC PANEL WITH GFR
AG Ratio: 1.4 (calc) (ref 1.0–2.5)
ALT: 29 U/L (ref 9–46)
AST: 19 U/L (ref 10–40)
Albumin: 4.1 g/dL (ref 3.6–5.1)
Alkaline phosphatase (APISO): 111 U/L (ref 36–130)
BUN: 15 mg/dL (ref 7–25)
CO2: 26 mmol/L (ref 20–32)
Calcium: 9.1 mg/dL (ref 8.6–10.3)
Chloride: 103 mmol/L (ref 98–110)
Creat: 1.02 mg/dL (ref 0.60–1.35)
GFR, Est African American: 103 mL/min/{1.73_m2} (ref >=60)
GFR, Est Non African American: 89 mL/min/{1.73_m2} (ref >=60)
Globulin: 3 g/dL (calc) (ref 1.9–3.7)
Glucose, Bld: 93 mg/dL (ref 65–99)
Potassium: 3.7 mmol/L (ref 3.5–5.3)
Sodium: 139 mmol/L (ref 135–146)
Total Bilirubin: 0.6 mg/dL (ref 0.2–1.2)
Total Protein: 7.1 g/dL (ref 6.1–8.1)

## 2019-04-26 LAB — LIPID PANEL
Cholesterol: 172 mg/dL (ref <200)
HDL: 46 mg/dL (ref >=40)
LDL Cholesterol (Calc): 111 mg/dL (calc) — ABNORMAL HIGH
Non-HDL Cholesterol (Calc): 126 mg/dL (calc) (ref <130)
Total CHOL/HDL Ratio: 3.7 (calc) (ref <5.0)
Triglycerides: 64 mg/dL (ref <150)

## 2019-04-27 ENCOUNTER — Other Ambulatory Visit: Payer: Self-pay | Admitting: Family Medicine

## 2019-04-28 ENCOUNTER — Encounter: Payer: Self-pay | Admitting: Family Medicine

## 2019-05-12 ENCOUNTER — Ambulatory Visit: Payer: Medicare Other | Admitting: Family Medicine

## 2019-05-21 ENCOUNTER — Other Ambulatory Visit: Payer: Self-pay | Admitting: Family Medicine

## 2019-06-10 ENCOUNTER — Other Ambulatory Visit: Payer: Self-pay

## 2019-06-10 ENCOUNTER — Encounter: Payer: Medicare Other | Attending: Family Medicine | Admitting: Nutrition

## 2019-06-10 ENCOUNTER — Encounter: Payer: Self-pay | Admitting: Nutrition

## 2019-06-10 VITALS — Ht 73.0 in | Wt >= 6400 oz

## 2019-06-10 DIAGNOSIS — I10 Essential (primary) hypertension: Secondary | ICD-10-CM | POA: Insufficient documentation

## 2019-06-10 NOTE — Progress Notes (Signed)
  Medical Nutrition Therapy:  Appt start time: 0830 end time:  0930.  Assessment:  Primary concerns today: Obesity. Lives with is wife. She does most of the cooking and shopping. He was a long over the road truck driver.Marland Kitchen Not working right now. He notes he has gained 50 lbs in the last year from driving. Wants to lose 100 lbs. Doesn't have diabetes. PMH: Asthma and HTN.. Started using a total gym and been working out 3 days a week. Has changed his eating habits and no longer eating fast foods and processed food. Will get batteries for his scale at home. Current diet is improving. Needs more fresh fruits and vegetables and cut out sweetened drinks. Wiling to make lifestyle changes to improve his health.  CMP Latest Ref Rng & Units 04/25/2019 10/12/2017 04/07/2016  Glucose 65 - 99 mg/dL 93 92 89  BUN 7 - 25 mg/dL 15 11 12   Creatinine 0.60 - 1.35 mg/dL 1.02 1.11 1.16  Sodium 135 - 146 mmol/L 139 139 140  Potassium 3.5 - 5.3 mmol/L 3.7 4.0 3.9  Chloride 98 - 110 mmol/L 103 101 102  CO2 20 - 32 mmol/L 26 31 30   Calcium 8.6 - 10.3 mg/dL 9.1 9.1 9.2  Total Protein 6.1 - 8.1 g/dL 7.1 6.9 7.2  Total Bilirubin 0.2 - 1.2 mg/dL 0.6 0.7 0.8  Alkaline Phos 40 - 115 U/L - - 122(H)  AST 10 - 40 U/L 19 20 23   ALT 9 - 46 U/L 29 31 33   Lipid Panel     Component Value Date/Time   CHOL 172 04/25/2019 0915   CHOL 203 08/28/2012   TRIG 64 04/25/2019 0915   HDL 46 04/25/2019 0915   CHOLHDL 3.7 04/25/2019 0915   VLDL 13 04/07/2016 0842   LDLCALC 111 (H) 04/25/2019 0915     Preferred Learning Style:     No preference indicated   Learning Readiness:  Ready  Change in progress   MEDICATIONS:    DIETARY INTAKE:  24-hr recall:  B ( AM): (fast food breakfast); Now: Oatmeal quaker 1 cup, water Snk ( AM): L ( PM): white rice 1-1/2c, baked chicken thighs 2, water  Snk ( PM): chocolate kisses D ( PM): veggie ;pizza 4 slices, sweet tea Snk ( PM):  Beverages:water, tea,  Usual physical activity:  working out 3 times per week total gym.  Estimated energy needs: 1800 calories 200 g carbohydrates 135 g protein 50 g fat  Progress Towards Goal(s):  In progress.   Nutritional Diagnosis:  NB-1.1 Food and nutrition-related knowledge deficit As related to Morbid Obesity.  As evidenced by BMI 57.    Intervention:  Nutrition and weight loss  education provided on My Plate, CHO counting, meal planning, portion sizes, timing of meals, avoiding snacks between meals , taking medications as prescribed, benefits of exercising 30-60 minutes per day and prevention of DM. Marland Kitchenoals  Follow My Plate Eat meals on time Cut out snacks and junk food Work out 5 days per week Walk 30 minutes. Use MYFitnesspal app and Mapmywalk app. Lose 10 lbs  Teaching Method Utilized: Visual Auditory Hands on  Handouts given during visit include:  The Plate Method   Weight loss tips   Barriers to learning/adherence to lifestyle change: none  Demonstrated degree of understanding via:  Teach Back   Monitoring/Evaluation:  Dietary intake, exercise, , and body weight in 1 month(s).

## 2019-06-10 NOTE — Patient Instructions (Signed)
Goals  Follow My Plate Eat meals on time Cut out snacks and junk food Work out 5 days per week Walk 30 minutes. Use MYFitnesspal app and Mapmywalk app. Lose 10 lbs.

## 2019-06-12 ENCOUNTER — Ambulatory Visit (INDEPENDENT_AMBULATORY_CARE_PROVIDER_SITE_OTHER): Payer: Self-pay | Admitting: *Deleted

## 2019-06-12 ENCOUNTER — Other Ambulatory Visit: Payer: Self-pay

## 2019-06-12 DIAGNOSIS — Z86010 Personal history of colonic polyps: Secondary | ICD-10-CM

## 2019-06-12 NOTE — Progress Notes (Addendum)
Gastroenterology Pre-Procedure Review  Request Date: 06/12/2019 Requesting Physician: 5 year recall, Last TCS 12/11/2012 done by Dr. Gala Romney, colonic diverticulosis, hx tubulovillous adenoma removed elsewhere 2011  PATIENT REVIEW QUESTIONS: The patient responded to the following health history questions as indicated:    1. Diabetes Melitis: no 2. Joint replacements in the past 12 months: no 3. Major health problems in the past 3 months: no 4. Has an artificial valve or MVP: no 5. Has a defibrillator: no 6. Has been advised in past to take antibiotics in advance of a procedure like teeth cleaning: no 7. Family history of colon cancer: no  8. Alcohol Use: no 9. Illicit drug Use: no 10. History of sleep apnea: yes, CPAP  11. History of coronary artery or other vascular stents placed within the last 12 months: no 12. History of any prior anesthesia complications: no 13. There is no height or weight on file to calculate BMI. ht: 6'1 wt: 440 lbs      MEDICATIONS & ALLERGIES:    Patient reports the following regarding taking any blood thinners:   Plavix? no Aspirin? no Coumadin? no Brilinta? no Xarelto? no Eliquis? no Pradaxa? no Savaysa? no Effient? no   Patient confirms/reports the following medications:  Current Outpatient Medications  Medication Sig Dispense Refill  . albuterol (PROVENTIL HFA;VENTOLIN HFA) 108 (90 BASE) MCG/ACT inhaler Inhale 2 puffs into the lungs every 6 (six) hours as needed for wheezing or shortness of breath. 1 Inhaler 3  . amLODipine (NORVASC) 10 MG tablet TAKE 1 TABLET BY MOUTH  DAILY FOR BLOOD PRESSURE 90 tablet 3  . atenolol (TENORMIN) 50 MG tablet Take 1 tablet (50 mg total) by mouth daily. 90 tablet 3  . hydrochlorothiazide (HYDRODIURIL) 25 MG tablet TAKE 1 TABLET BY MOUTH  DAILY 90 tablet 3  . ibuprofen (ADVIL,MOTRIN) 600 MG tablet Take 1 tablet (600 mg total) by mouth 4 (four) times daily. (Patient taking differently: Take 600 mg by mouth as needed. )  30 tablet 0  . KLOR-CON M20 20 MEQ tablet TAKE 1 TABLET BY MOUTH DAILY 90 tablet 0  . risperiDONE (RISPERDAL) 2 MG tablet TAKE 1 TABLET BY MOUTH 3  TIMES DAILY 270 tablet 3   No current facility-administered medications for this visit.     Patient confirms/reports the following allergies:  Allergies  Allergen Reactions  . Benazepril Swelling    Mouth swelling    No orders of the defined types were placed in this encounter.   AUTHORIZATION INFORMATION Primary Insurance: [employer] Medicare,  ID #: [id number],  Group #: [id number] Pre-Cert / Auth required: No, not required  SCHEDULE INFORMATION: Procedure has been scheduled as follows:  Date: , Time:  Location: [facility] with Dr. Gala Romney  This Gastroenterology Pre-Precedure Review Form is being routed to the following provider(s): Neil Crouch, PA-C

## 2019-06-16 NOTE — Progress Notes (Signed)
Ok to schedule. Make sure endo aware of sleep apnea/cpap.

## 2019-06-17 NOTE — Progress Notes (Signed)
ACTUALLY PATIENT NEEDS OV DUE TO BMI.

## 2019-06-18 NOTE — Progress Notes (Signed)
Called pt and informed him that he would need an ov before scheduling a procedure.  Pt scheduled an ov for 06/20/2019.

## 2019-06-20 ENCOUNTER — Encounter: Payer: Self-pay | Admitting: Gastroenterology

## 2019-06-20 ENCOUNTER — Other Ambulatory Visit: Payer: Self-pay

## 2019-06-20 ENCOUNTER — Ambulatory Visit (INDEPENDENT_AMBULATORY_CARE_PROVIDER_SITE_OTHER): Payer: Medicare Other | Admitting: Gastroenterology

## 2019-06-20 ENCOUNTER — Other Ambulatory Visit: Payer: Self-pay | Admitting: *Deleted

## 2019-06-20 ENCOUNTER — Encounter: Payer: Self-pay | Admitting: *Deleted

## 2019-06-20 VITALS — BP 135/90 | HR 87 | Temp 97.0°F | Ht 73.0 in | Wt >= 6400 oz

## 2019-06-20 DIAGNOSIS — G4733 Obstructive sleep apnea (adult) (pediatric): Secondary | ICD-10-CM | POA: Diagnosis not present

## 2019-06-20 DIAGNOSIS — Z86010 Personal history of colonic polyps: Secondary | ICD-10-CM | POA: Diagnosis not present

## 2019-06-20 MED ORDER — PEG 3350-KCL-NA BICARB-NACL 420 G PO SOLR: ORAL | 0 refills | Status: AC

## 2019-06-20 NOTE — Assessment & Plan Note (Signed)
H/o advanced adenomas in the past (tubulovillous adenoma). Presenting for 5 year surveillance colonoscopy. Due to morbid obesity, sleep apnea, plan for deep sedation.  I have discussed the risks, alternatives, benefits with regards to but not limited to the risk of reaction to medication, bleeding, infection, perforation and the patient is agreeable to proceed. Written consent to be obtained.

## 2019-06-20 NOTE — Progress Notes (Signed)
CC'ED TO PCP 

## 2019-06-20 NOTE — Patient Instructions (Signed)
1. Colonoscopy as scheduled. Please see separate instructions. 

## 2019-06-20 NOTE — Progress Notes (Signed)
Primary Care Physician:  [name], MD  Primary Gastroenterologist:  Garfield Cornea, MD   Chief Complaint  Patient presents with  . Consult    TCS last done 2014    HPI:  Jimmy Miller is a 43 y.o. male here to schedule surveillance colonoscopy.  Due to BMI, he was requested to come in for an office visit to discuss sedation.  His last colonoscopy was in May 2014.  He had colonic diverticulosis.  Previously in 2011 he had a history of tubulovillous adenoma. H/o sleep apnea. Uses CPAP.  BM regular. No melena, brbpr. No abdominal pain. No UGI symptoms. No FH of CRC.   Current Outpatient Medications  Medication Sig Dispense Refill  . albuterol (PROVENTIL HFA;VENTOLIN HFA) 108 (90 BASE) MCG/ACT inhaler Inhale 2 puffs into the lungs every 6 (six) hours as needed for wheezing or shortness of breath. 1 Inhaler 3  . amLODipine (NORVASC) 10 MG tablet TAKE 1 TABLET BY MOUTH  DAILY FOR BLOOD PRESSURE 90 tablet 3  . atenolol (TENORMIN) 50 MG tablet Take 1 tablet (50 mg total) by mouth daily. 90 tablet 3  . hydrochlorothiazide (HYDRODIURIL) 25 MG tablet TAKE 1 TABLET BY MOUTH  DAILY 90 tablet 3  . ibuprofen (ADVIL,MOTRIN) 600 MG tablet Take 1 tablet (600 mg total) by mouth 4 (four) times daily. (Patient taking differently: Take 600 mg by mouth as needed. ) 30 tablet 0  . KLOR-CON M20 20 MEQ tablet TAKE 1 TABLET BY MOUTH DAILY 90 tablet 0  . risperiDONE (RISPERDAL) 2 MG tablet TAKE 1 TABLET BY MOUTH 3  TIMES DAILY 270 tablet 3   No current facility-administered medications for this visit.     Allergies as of 06/20/2019 - Review Complete 06/20/2019  Allergen Reaction Noted  . Benazepril Swelling 11/13/2012    Past Medical History:  Diagnosis Date  . Asthma   . Colon polyp   . HTN (hypertension)   . Obesity, morbid, BMI 50 or higher ([place])   . Tobacco abuse   . Tourette disorder   . Ventral hernia     Past Surgical History:  Procedure Laterality Date  . ABDOMINAL HERNIA REPAIR      as a baby  . COLONOSCOPY  2011   [facility]: 31mm tubulovillous adenoma removed from rectum  . COLONOSCOPY  2008   [facility]: ICV erythematous (bx lymphoid aggregate but no active inflammation or granulomas). TRV colon polyp 58mm (features of juvenile polyp, focal non-caseating granuloma)  . COLONOSCOPY N/A 12/11/2012   Dr. Gala Romney: Diverticulosis.  Next colonoscopy 5 years    Family History  Problem Relation Age of Onset  . Cancer Maternal Grandfather        unknown  . Colon cancer Neg Hx   . Liver disease Neg Hx     Social History   Socioeconomic History  . Marital status: Married    Spouse name: Not on file  . Number of children: 0  . Years of education: Not on file  . Highest education level: Not on file  Occupational History  . Occupation: disability    Employer: [employer]  . Financial resource strain: Not on file  . Food insecurity    Worry: Not on file    Inability: Not on file  . Transportation needs    Medical: Not on file    Non-medical: Not on file  Tobacco Use  . Smoking status: Current Every Day Smoker    Packs/day: 0.50  Types: Cigarettes  . Smokeless tobacco: Former NeurosurgeonUser    Quit date: 01/05/2013  Substance and Sexual Activity  . Alcohol use: Yes    Comment: 12 ounce beer occasionally  . Drug use: No  . Sexual activity: Not on file  Lifestyle  . Physical activity    Days per week: Not on file    Minutes per session: Not on file  . Stress: Not on file  Relationships  . Social Musicianconnections    Talks on phone: Not on file    Gets together: Not on file    Attends religious service: Not on file    Active member of club or organization: Not on file    Attends meetings of clubs or organizations: Not on file    Relationship status: Not on file  . Intimate partner violence    Fear of current or ex partner: Not on file    Emotionally abused: Not on file    Physically abused: Not on file    Forced sexual activity: Not  on file  Other Topics Concern  . Not on file  Social History Narrative  . Not on file      ROS:  General: Negative for anorexia, weight loss, fever, chills, fatigue, weakness. Eyes: Negative for vision changes.  ENT: Negative for hoarseness, difficulty swallowing , nasal congestion. CV: Negative for chest pain, angina, palpitations, dyspnea on exertion, peripheral edema.  Respiratory: Negative for dyspnea at rest, dyspnea on exertion, cough, sputum, wheezing.  GI: See history of present illness. GU:  Negative for dysuria, hematuria, urinary incontinence, urinary frequency, nocturnal urination.  MS: Negative for joint pain, low back pain.  Derm: Negative for rash or itching.  Neuro: Negative for weakness, abnormal sensation, seizure, frequent headaches, memory loss, confusion.  Psych: Negative for anxiety, depression, suicidal ideation, hallucinations.  Endo: Negative for unusual weight change.  Heme: Negative for bruising or bleeding. Allergy: Negative for rash or hives.    Physical Examination:  BP 135/90   Pulse 87   Temp (!) 97 F (36.1 C) (Oral)   Ht 6\' 1"  (1.854 m)   Wt (!) 440 lb 9.6 oz (199.9 kg)   BMI 58.13 kg/m    General: Well-nourished, well-developed in no acute distress.  Head: Normocephalic, atraumatic.   Eyes: Conjunctiva pink, no icterus. Mouth: Oropharyngeal mucosa moist and pink , no lesions erythema or exudate. Neck: Supple without thyromegaly, masses, or lymphadenopathy.  Lungs: Clear to auscultation bilaterally.  Heart: Regular rate and rhythm, no murmurs rubs or gallops.  Abdomen: Bowel sounds are normal, nontender, nondistended, no hepatosplenomegaly or masses, no abdominal bruits or    hernia , no rebound or guarding.   Rectal: not performed Extremities: No lower extremity edema. No clubbing or deformities.  Neuro: Alert and oriented x 4 , grossly normal neurologically.  Skin: Warm and dry, no rash or jaundice.   Psych: Alert and cooperative,  normal mood and affect.  Labs: Lab Results  Component Value Date   CREATININE 1.02 04/25/2019   BUN 15 04/25/2019   NA 139 04/25/2019   K 3.7 04/25/2019   CL 103 04/25/2019   CO2 26 04/25/2019   Lab Results  Component Value Date   ALT 29 04/25/2019   AST 19 04/25/2019   ALKPHOS 111 04/25/2019   BILITOT 0.6 04/25/2019   Lab Results  Component Value Date   WBC 5.0 04/25/2019   HGB 13.8 04/25/2019   HCT 41.8 04/25/2019   MCV 83.8 04/25/2019   PLT  190 04/25/2019     Imaging Studies: No results found.

## 2019-06-23 ENCOUNTER — Encounter: Payer: Self-pay | Admitting: *Deleted

## 2019-07-10 ENCOUNTER — Encounter: Payer: Medicare Other | Admitting: Nutrition

## 2019-08-28 ENCOUNTER — Ambulatory Visit: Payer: Medicare Other | Admitting: Nutrition

## 2019-09-08 ENCOUNTER — Telehealth: Payer: Self-pay

## 2019-09-08 NOTE — Telephone Encounter (Signed)
Pt called office and LMOVM asking date of TCS.  Called pt, informed him TCS is scheduled for 09/18/19. Re-mailed packet for TCS.

## 2019-09-11 NOTE — Patient Instructions (Signed)
Your procedure is scheduled on: 09/18/2019  Report to [facility] at  7:00   AM.  Call this number if you have problems the morning of surgery: [phone]   Remember:              Follow Directions on the letter you received from Your Physician's office regarding the Bowel Prep              No Smoking the day of Procedure :   Take these medicines the morning of surgery with A SIP OF WATER: Amlodipine, Atenolol and Risperidone   Do not wear jewelry, make-up or nail polish.    Do not bring valuables to the hospital.  Contacts, dentures or bridgework may not be worn into surgery.  .   Patients discharged the day of surgery will not be allowed to drive home.     Colonoscopy, Adult, Care After This sheet gives you information about how to care for yourself after your procedure. Your health care provider may also give you more specific instructions. If you have problems or questions, contact your health care provider. What can I expect after the procedure? After the procedure, it is common to have:  A small amount of blood in your stool for 24 hours after the procedure.  Some gas.  Mild abdominal cramping or bloating.  Follow these instructions at home: General instructions   For the first 24 hours after the procedure: ? Do not drive or use machinery. ? Do not sign important documents. ? Do not drink alcohol. ? Do your regular daily activities at a slower pace than normal. ? Eat soft, easy-to-digest foods. ? Rest often.  Take over-the-counter or prescription medicines only as told by your health care provider.  It is up to you to get the results of your procedure. Ask your health care provider, or the department performing the procedure, when your results will be ready. Relieving cramping and bloating  Try walking around when you have cramps or feel bloated.  Apply heat to your abdomen as told by your health care provider. Use a heat source that your health care  provider recommends, such as a moist heat pack or a heating pad. ? Place a towel between your skin and the heat source. ? Leave the heat on for 20-30 minutes. ? Remove the heat if your skin turns bright red. This is especially important if you are unable to feel pain, heat, or cold. You may have a greater risk of getting burned. Eating and drinking  Drink enough fluid to keep your urine clear or pale yellow.  Resume your normal diet as instructed by your health care provider. Avoid heavy or fried foods that are hard to digest.  Avoid drinking alcohol for as long as instructed by your health care provider. Contact a health care provider if:  You have blood in your stool 2-3 days after the procedure. Get help right away if:  You have more than a small spotting of blood in your stool.  You pass large blood clots in your stool.  Your abdomen is swollen.  You have nausea or vomiting.  You have a fever.  You have increasing abdominal pain that is not relieved with medicine. This information is not intended to replace advice given to you by your health care provider. Make sure you discuss any questions you have with your health care provider. Document Released: 03/07/2004 Document Revised: 04/17/2016 Document Reviewed: 10/05/2015 Elsevier Interactive Patient Education  2018  [employer].

## 2019-09-16 ENCOUNTER — Other Ambulatory Visit (HOSPITAL_COMMUNITY)
Admission: RE | Admit: 2019-09-16 | Discharge: 2019-09-16 | Disposition: A | Discharge status: Home / Self Care | Payer: Medicare Other | Source: Ambulatory Visit | Attending: Internal Medicine | Admitting: Internal Medicine

## 2019-09-16 ENCOUNTER — Encounter: Payer: Self-pay | Admitting: *Deleted

## 2019-09-16 ENCOUNTER — Encounter (HOSPITAL_COMMUNITY)
Admission: RE | Admit: 2019-09-16 | Discharge: 2019-09-16 | Disposition: A | Discharge status: Home / Self Care | Payer: Medicare Other | Source: Ambulatory Visit | Attending: Internal Medicine | Admitting: Internal Medicine

## 2019-09-16 ENCOUNTER — Other Ambulatory Visit: Payer: Self-pay

## 2019-09-16 ENCOUNTER — Telehealth: Payer: Self-pay | Admitting: Internal Medicine

## 2019-09-16 NOTE — Telephone Encounter (Signed)
Tried to call pt, no answer, LMOVM for return call.  

## 2019-09-16 NOTE — Telephone Encounter (Signed)
[phone]  PATIENT NEEDS TO RESCHEDULE PROCEDURE DUE TO WORK

## 2019-09-16 NOTE — Telephone Encounter (Signed)
Pt called back and is rescheduled to 12/01/2019.  Pt is aware that we will mail him out new prep instructions, Covid screening information, and Pre-op appointment.

## 2019-09-19 DIAGNOSIS — G4733 Obstructive sleep apnea (adult) (pediatric): Secondary | ICD-10-CM | POA: Diagnosis not present

## 2019-11-26 NOTE — Patient Instructions (Signed)
Jimmy Miller  11/26/2019     @PREFPERIOPPHARMACY @   Your procedure is scheduled on  12/01/2019   Report to [facility] at 0700  A.M.  Call this number if you have problems the morning of surgery:  [phone]   Remember:  Follow the diet and prep instructions given to you by Dr [phone] office.                     Take these medicines the morning of surgery with A SIP OF WATER  Amlodipine, atenolol, risperdal.    Do not wear jewelry, make-up or nail polish.  Do not wear lotions, powders, or perfumes. Please wear deodorant and brush your teeth.  Do not shave 48 hours prior to surgery.  Men may shave face and neck.  Do not bring valuables to the hospital.  [facility] is not responsible for any belongings or valuables.  Contacts, dentures or bridgework may not be worn into surgery.  Leave your suitcase in the car.  After surgery it may be brought to your room.  For patients admitted to the hospital, discharge time will be determined by your treatment team.  Patients discharged the day of surgery will not be allowed to drive home.   Name and phone number of your driver:   family Special instructions:  DO NOT smoke the day of your procedure.  Please read over the following fact sheets that you were given. Anesthesia Post-op Instructions and Care and Recovery After Surgery       Colonoscopy, Adult, Care After This sheet gives you information about how to care for yourself after your procedure. Your health care provider may also give you more specific instructions. If you have problems or questions, contact your health care provider. What can I expect after the procedure? After the procedure, it is common to have:  A small amount of blood in your stool for 24 hours after the procedure.  Some gas.  Mild cramping or bloating of your abdomen. Follow these instructions at home: Eating and drinking   Drink enough fluid to keep your urine pale yellow.  Follow  instructions from your health care provider about eating or drinking restrictions.  Resume your normal diet as instructed by your health care provider. Avoid heavy or fried foods that are hard to digest. Activity  Rest as told by your health care provider.  Avoid sitting for a long time without moving. Get up to take short walks every 1-2 hours. This is important to improve blood flow and breathing. Ask for help if you feel weak or unsteady.  Return to your normal activities as told by your health care provider. Ask your health care provider what activities are safe for you. Managing cramping and bloating   Try walking around when you have cramps or feel bloated.  Apply heat to your abdomen as told by your health care provider. Use the heat source that your health care provider recommends, such as a moist heat pack or a heating pad. ? Place a towel between your skin and the heat source. ? Leave the heat on for 20-30 minutes. ? Remove the heat if your skin turns bright red. This is especially important if you are unable to feel pain, heat, or cold. You may have a greater risk of getting burned. General instructions  For the first 24 hours after the procedure: ? Do not drive or use machinery. ? Do  not sign important documents. ? Do not drink alcohol. ? Do your regular daily activities at a slower pace than normal. ? Eat soft foods that are easy to digest.  Take over-the-counter and prescription medicines only as told by your health care provider.  Keep all follow-up visits as told by your health care provider. This is important. Contact a health care provider if:  You have blood in your stool 2-3 days after the procedure. Get help right away if you have:  More than a small spotting of blood in your stool.  Large blood clots in your stool.  Swelling of your abdomen.  Nausea or vomiting.  A fever.  Increasing pain in your abdomen that is not relieved with  medicine. Summary  After the procedure, it is common to have a small amount of blood in your stool. You may also have mild cramping and bloating of your abdomen.  For the first 24 hours after the procedure, do not drive or use machinery, sign important documents, or drink alcohol.  Get help right away if you have a lot of blood in your stool, nausea or vomiting, a fever, or increased pain in your abdomen. This information is not intended to replace advice given to you by your health care provider. Make sure you discuss any questions you have with your health care provider. Document Revised: 02/17/2019 Document Reviewed: 02/17/2019 Elsevier Patient Education  [place] After These instructions provide you with information about caring for yourself after your procedure. Your health care provider may also give you more specific instructions. Your treatment has been planned according to current medical practices, but problems sometimes occur. Call your health care provider if you have any problems or questions after your procedure. What can I expect after the procedure? After your procedure, you may:  Feel sleepy for several hours.  Feel clumsy and have poor balance for several hours.  Feel forgetful about what happened after the procedure.  Have poor judgment for several hours.  Feel nauseous or vomit.  Have a sore throat if you had a breathing tube during the procedure. Follow these instructions at home: For at least 24 hours after the procedure:      Have a responsible adult stay with you. It is important to have someone help care for you until you are awake and alert.  Rest as needed.  Do not: ? Participate in activities in which you could fall or become injured. ? Drive. ? Use heavy machinery. ? Drink alcohol. ? Take sleeping pills or medicines that cause drowsiness. ? Make important decisions or sign legal documents. ? Take care  of children on your own. Eating and drinking  Follow the diet that is recommended by your health care provider.  If you vomit, drink water, juice, or soup when you can drink without vomiting.  Make sure you have little or no nausea before eating solid foods. General instructions  Take over-the-counter and prescription medicines only as told by your health care provider.  If you have sleep apnea, surgery and certain medicines can increase your risk for breathing problems. Follow instructions from your health care provider about wearing your sleep device: ? Anytime you are sleeping, including during daytime naps. ? While taking prescription pain medicines, sleeping medicines, or medicines that make you drowsy.  If you smoke, do not smoke without supervision.  Keep all follow-up visits as told by your health care provider. This is important. Contact a health care  provider if:  You keep feeling nauseous or you keep vomiting.  You feel light-headed.  You develop a rash.  You have a fever. Get help right away if:  You have trouble breathing. Summary  For several hours after your procedure, you may feel sleepy and have poor judgment.  Have a responsible adult stay with you for at least 24 hours or until you are awake and alert. This information is not intended to replace advice given to you by your health care provider. Make sure you discuss any questions you have with your health care provider. Document Revised: 10/22/2017 Document Reviewed: 11/14/2015 Elsevier Patient Education  [place].

## 2019-11-27 ENCOUNTER — Telehealth: Payer: Self-pay | Admitting: *Deleted

## 2019-11-27 ENCOUNTER — Encounter (HOSPITAL_COMMUNITY)
Admission: RE | Admit: 2019-11-27 | Discharge: 2019-11-27 | Disposition: A | Discharge status: Home / Self Care | Payer: Medicare Other | Source: Ambulatory Visit | Attending: Internal Medicine | Admitting: Internal Medicine

## 2019-11-27 NOTE — Telephone Encounter (Signed)
Called pt and he states his pre-op/covid was scheduled for tomorrow. He reports he did not know it had changed. I provided carolyn # in endo to call.

## 2019-11-27 NOTE — Telephone Encounter (Signed)
-----   Message from Elsie Amis, RN sent at 11/27/2019  9:04 AM EDT ----- Regarding: no show Good morning! Jimmy Miller did not show for PAT this morning.

## 2019-11-27 NOTE — Telephone Encounter (Signed)
LMOVM for pt. Patient has already rescheduled before. Last OV November with [facility]

## 2019-11-28 ENCOUNTER — Encounter (HOSPITAL_COMMUNITY)
Admission: RE | Admit: 2019-11-28 | Discharge: 2019-11-28 | Disposition: A | Discharge status: Home / Self Care | Payer: Medicare Other | Source: Ambulatory Visit | Attending: Internal Medicine | Admitting: Internal Medicine

## 2019-11-28 ENCOUNTER — Other Ambulatory Visit (HOSPITAL_COMMUNITY): Payer: Medicare Other

## 2019-11-28 ENCOUNTER — Encounter (HOSPITAL_COMMUNITY): Payer: Self-pay

## 2019-11-28 ENCOUNTER — Other Ambulatory Visit: Payer: Self-pay

## 2019-11-28 ENCOUNTER — Telehealth: Payer: Self-pay | Admitting: Internal Medicine

## 2019-11-28 ENCOUNTER — Other Ambulatory Visit (HOSPITAL_COMMUNITY)
Admission: RE | Admit: 2019-11-28 | Discharge: 2019-11-28 | Disposition: A | Discharge status: Home / Self Care | Payer: Medicare Other | Source: Ambulatory Visit | Attending: Internal Medicine | Admitting: Internal Medicine

## 2019-11-28 DIAGNOSIS — Z20822 Contact with and (suspected) exposure to covid-19: Secondary | ICD-10-CM | POA: Diagnosis not present

## 2019-11-28 DIAGNOSIS — Z01812 Encounter for preprocedural laboratory examination: Secondary | ICD-10-CM | POA: Insufficient documentation

## 2019-11-28 HISTORY — DX: Sleep apnea, unspecified: G47.30

## 2019-11-28 LAB — BASIC METABOLIC PANEL
Anion gap: 10 (ref 5–15)
BUN: 14 mg/dL (ref 6–20)
CO2: 27 mmol/L (ref 22–32)
Calcium: 8.7 mg/dL — ABNORMAL LOW (ref 8.9–10.3)
Chloride: 102 mmol/L (ref 98–111)
Creatinine, Ser: 0.97 mg/dL (ref 0.61–1.24)
GFR calc Af Amer: >60 mL/min (ref >60)
GFR calc non Af Amer: >60 mL/min (ref >60)
Glucose, Bld: 94 mg/dL (ref 70–99)
Potassium: 3.3 mmol/L — ABNORMAL LOW (ref 3.5–5.1)
Sodium: 139 mmol/L (ref 135–145)

## 2019-11-28 MED ORDER — CLENPIQ 10-3.5-12 MG-GM -GM/160ML PO SOLN: 1.0000 | Freq: Once | ORAL | 0 refills | Status: AC

## 2019-11-28 NOTE — Telephone Encounter (Signed)
Pt called back to say [facility] found his prescription and to disregard the previous call.

## 2019-11-28 NOTE — Telephone Encounter (Signed)
Called [facility] and advised to disregard Rx sent in.

## 2019-11-28 NOTE — Telephone Encounter (Signed)
Pt said his pharmacy [facility] was out of his prep and he is scheduled for Monday. Please advise. [phone]

## 2019-11-29 LAB — SARS CORONAVIRUS 2 (TAT 6-24 HRS): SARS Coronavirus 2: NEGATIVE

## 2019-12-01 ENCOUNTER — Encounter (HOSPITAL_COMMUNITY): Payer: Self-pay | Admitting: Internal Medicine

## 2019-12-01 ENCOUNTER — Encounter (HOSPITAL_COMMUNITY)
Admission: RE | Disposition: A | Discharge status: Home / Self Care | Payer: Self-pay | Source: Home / Self Care | Attending: Internal Medicine

## 2019-12-01 ENCOUNTER — Ambulatory Visit (HOSPITAL_COMMUNITY)
Admission: RE | Admit: 2019-12-01 | Discharge: 2019-12-01 | Disposition: A | Discharge status: Home / Self Care | Payer: Medicare Other | Attending: Internal Medicine | Admitting: Internal Medicine

## 2019-12-01 ENCOUNTER — Ambulatory Visit (HOSPITAL_COMMUNITY): Payer: Medicare Other | Admitting: Anesthesiology

## 2019-12-01 DIAGNOSIS — Z86010 Personal history of colonic polyps: Secondary | ICD-10-CM | POA: Diagnosis not present

## 2019-12-01 DIAGNOSIS — Z791 Long term (current) use of non-steroidal anti-inflammatories (NSAID): Secondary | ICD-10-CM | POA: Insufficient documentation

## 2019-12-01 DIAGNOSIS — F17210 Nicotine dependence, cigarettes, uncomplicated: Secondary | ICD-10-CM | POA: Insufficient documentation

## 2019-12-01 DIAGNOSIS — Z79899 Other long term (current) drug therapy: Secondary | ICD-10-CM | POA: Diagnosis not present

## 2019-12-01 DIAGNOSIS — Z809 Family history of malignant neoplasm, unspecified: Secondary | ICD-10-CM | POA: Diagnosis not present

## 2019-12-01 DIAGNOSIS — I10 Essential (primary) hypertension: Secondary | ICD-10-CM | POA: Insufficient documentation

## 2019-12-01 DIAGNOSIS — K621 Rectal polyp: Secondary | ICD-10-CM | POA: Diagnosis not present

## 2019-12-01 DIAGNOSIS — J45909 Unspecified asthma, uncomplicated: Secondary | ICD-10-CM | POA: Diagnosis not present

## 2019-12-01 DIAGNOSIS — G4730 Sleep apnea, unspecified: Secondary | ICD-10-CM | POA: Insufficient documentation

## 2019-12-01 DIAGNOSIS — K5730 Diverticulosis of large intestine without perforation or abscess without bleeding: Secondary | ICD-10-CM | POA: Diagnosis not present

## 2019-12-01 DIAGNOSIS — Z888 Allergy status to other drugs, medicaments and biological substances status: Secondary | ICD-10-CM | POA: Insufficient documentation

## 2019-12-01 DIAGNOSIS — Z1211 Encounter for screening for malignant neoplasm of colon: Secondary | ICD-10-CM | POA: Diagnosis not present

## 2019-12-01 DIAGNOSIS — Z7951 Long term (current) use of inhaled steroids: Secondary | ICD-10-CM | POA: Insufficient documentation

## 2019-12-01 DIAGNOSIS — F952 Tourette's disorder: Secondary | ICD-10-CM | POA: Insufficient documentation

## 2019-12-01 DIAGNOSIS — Z6841 Body Mass Index (BMI) 40.0 and over, adult: Secondary | ICD-10-CM | POA: Insufficient documentation

## 2019-12-01 HISTORY — PX: COLONOSCOPY WITH PROPOFOL: SHX5780

## 2019-12-01 HISTORY — PX: POLYPECTOMY: SHX5525

## 2019-12-01 SURGERY — COLONOSCOPY WITH PROPOFOL
Anesthesia: General

## 2019-12-01 MED ORDER — LACTATED RINGERS IV SOLN
Freq: Once | INTRAVENOUS | Status: AC
  Administered 2019-12-01: 1000 mL via INTRAVENOUS

## 2019-12-01 MED ORDER — EPHEDRINE 5 MG/ML INJ
INTRAVENOUS | Status: AC
  Filled 2019-12-01: qty 30

## 2019-12-01 MED ORDER — ESMOLOL HCL 100 MG/10ML IV SOLN
INTRAVENOUS | Status: AC
  Filled 2019-12-01: qty 30

## 2019-12-01 MED ORDER — LACTATED RINGERS IV SOLN: INTRAVENOUS | Status: DC | PRN

## 2019-12-01 MED ORDER — KETAMINE HCL 50 MG/5ML IJ SOSY
PREFILLED_SYRINGE | INTRAMUSCULAR | Status: AC
  Filled 2019-12-01: qty 5

## 2019-12-01 MED ORDER — KETAMINE HCL 10 MG/ML IJ SOLN
INTRAMUSCULAR | Status: DC | PRN
  Administered 2019-12-01: 30 mg via INTRAVENOUS

## 2019-12-01 MED ORDER — LIDOCAINE 2% (20 MG/ML) 5 ML SYRINGE
INTRAMUSCULAR | Status: AC
  Filled 2019-12-01: qty 25

## 2019-12-01 MED ORDER — PROPOFOL 500 MG/50ML IV EMUL
INTRAVENOUS | Status: DC | PRN
  Administered 2019-12-01: 150 ug/kg/min via INTRAVENOUS

## 2019-12-01 MED ORDER — LIDOCAINE HCL (CARDIAC) PF 100 MG/5ML IV SOSY
PREFILLED_SYRINGE | INTRAVENOUS | Status: DC | PRN
  Administered 2019-12-01: 50 mg via INTRATRACHEAL

## 2019-12-01 MED ORDER — PHENYLEPHRINE 40 MCG/ML (10ML) SYRINGE FOR IV PUSH (FOR BLOOD PRESSURE SUPPORT)
PREFILLED_SYRINGE | INTRAVENOUS | Status: AC
  Filled 2019-12-01: qty 40

## 2019-12-01 MED ORDER — CHLORHEXIDINE GLUCONATE CLOTH 2 % EX PADS: 6.0000 | MEDICATED_PAD | Freq: Once | CUTANEOUS | Status: DC

## 2019-12-01 NOTE — Anesthesia Postprocedure Evaluation (Signed)
Anesthesia Post Note  Patient: Jimmy Miller  Procedure(s) Performed: COLONOSCOPY WITH PROPOFOL (N/A ) POLYPECTOMY  Patient location during evaluation: PACU Anesthesia Type: General Level of consciousness: awake and alert Pain management: pain level controlled Vital Signs Assessment: post-procedure vital signs reviewed and stable Respiratory status: spontaneous breathing Cardiovascular status: stable Postop Assessment: no apparent nausea or vomiting Anesthetic complications: no     Last Vitals:  Vitals:   12/01/19 0910 12/01/19 0915  BP: (!) 131/102 (!) 131/102  Pulse: (!) 108 (!) 105  Resp: (!) 27 (!) 21  Temp: 36.8 C   SpO2: 95% 94%    Last Pain:  Vitals:   12/01/19 0910  TempSrc:   PainSc: 0-No pain                 Ramanda Paules Hristova

## 2019-12-01 NOTE — Op Note (Signed)
[facility] Patient Name: Jimmy Miller Procedure Date: 12/01/2019 8:20 AM MRN: [id number] Date of Birth: Oct 09, 1975 Attending MD: Gennette Pac , MD CSN: [id number] Age: 44 Admit Type: Outpatient Procedure:                Colonoscopy Indications:              High risk colon cancer surveillance: Personal                            history of colonic polyps Providers:                Gennette Pac, MD, Criselda Peaches. Patsy Lager, RN,                            Burke Keels, Technician Referring MD:              Medicines:                Propofol per Anesthesia Complications:            No immediate complications. Estimated Blood Loss:     Estimated blood loss was minimal. Estimated blood                            loss was minimal. Procedure:                Pre-Anesthesia Assessment:                           - Prior to the procedure, a History and Physical                            was performed, and patient medications and                            allergies were reviewed. The patient's tolerance of                            previous anesthesia was also reviewed. The risks                            and benefits of the procedure and the sedation                            options and risks were discussed with the patient.                            All questions were answered, and informed consent                            was obtained. Prior Anticoagulants: The patient has                            taken no previous anticoagulant or antiplatelet  agents. ASA Grade Assessment: III - A patient with                            severe systemic disease. After reviewing the risks                            and benefits, the patient was deemed in                            satisfactory condition to undergo the procedure.                           After obtaining informed consent, the colonoscope                            was passed under direct  vision. Throughout the                            procedure, the patient's blood pressure, pulse, and                            oxygen saturations were monitored continuously. The                            CF-[id number] ([id number]) scope was introduced through                            the anus and advanced to the the cecum, identified                            by appendiceal orifice and ileocecal valve. The                            colonoscopy was performed without difficulty. The                            patient tolerated the procedure well. The quality                            of the bowel preparation was adequate. Scope In: 8:49:09 AM Scope Out: 9:01:42 AM Scope Withdrawal Time: 0 hours 7 minutes 34 seconds  Total Procedure Duration: 0 hours 12 minutes 33 seconds  Findings:      The perianal and digital rectal examinations were normal.      Scattered medium-mouthed diverticula were found in the entire colon.      A 5 mm polyp was found in the mid rectum. The polyp was removed with a       cold snare. Resection and retrieval were complete. Estimated blood loss       was minimal.      The exam was otherwise without abnormality on direct and retroflexion       views. Impression:               - Diverticulosis in the entire examined colon.                           -  One polyp in the mid rectum, removed with a cold                            snare. Resected and retrieved.                           - The examination was otherwise normal on direct                            and retroflexion views. Moderate Sedation:      Moderate (conscious) sedation was personally administered by an       anesthesia professional. The following parameters were monitored: oxygen       saturation, heart rate, blood pressure, respiratory rate, EKG, adequacy       of pulmonary ventilation, and response to care. Recommendation:           - Patient has a contact number available for                             emergencies. The signs and symptoms of potential                            delayed complications were discussed with the                            patient. Return to normal activities tomorrow.                            Written discharge instructions were provided to the                            patient.                           - Advance diet as tolerated.                           - Continue present medications.                           - Repeat colonoscopy date to be determined after                            pending pathology results are reviewed for                            surveillance based on pathology results.                           - Return to GI office (date not yet determined). Procedure Code(s):        --- Professional ---                           [id number], Colonoscopy, flexible; with removal of  tumor(s), polyp(s), or other lesion(s) by snare                            technique Diagnosis Code(s):        --- Professional ---                           Z86.010, Personal history of colonic polyps                           K62.1, Rectal polyp                           K57.30, Diverticulosis of large intestine without                            perforation or abscess without bleeding CPT copyright 2019 American Medical Association. All rights reserved. The codes documented in this report are preliminary and upon coder review may  be revised to meet current compliance requirements. Cristopher Estimable. Poppy Mcafee, MD Norvel Richards, MD 12/01/2019 9:09:55 AM This report has been signed electronically. Number of Addenda: 0

## 2019-12-01 NOTE — Anesthesia Preprocedure Evaluation (Signed)
Anesthesia Evaluation  Patient identified by MRN, date of birth, ID band Patient awake    Reviewed: Allergy & Precautions, NPO status , Patient's Chart, lab work & pertinent test results, reviewed documented beta blocker date and time   Airway Mallampati: III  TM Distance: >3 FB Neck ROM: Full    Dental  (+) Missing, Dental Advisory Given   Pulmonary asthma , sleep apnea , neg COPD,  COPD inhaler, Current Smoker and Patient abstained from smoking.,    Pulmonary exam normal breath sounds clear to auscultation       Cardiovascular hypertension, Pt. on medications and Pt. on home beta blockers  Rhythm:Regular Rate:Normal     Neuro/Psych PSYCHIATRIC DISORDERS    GI/Hepatic negative GI ROS, Neg liver ROS,   Endo/Other  Morbid obesity  Renal/GU negative Renal ROS     Musculoskeletal negative musculoskeletal ROS (+)   Abdominal (+) + obese,   Peds  Hematology negative hematology ROS (+)   Anesthesia Other Findings   Reproductive/Obstetrics negative OB ROS                             Anesthesia Physical Anesthesia Plan  ASA: III  Anesthesia Plan: General   Post-op Pain Management:    Induction: Intravenous  PONV Risk Score and Plan:   Airway Management Planned: Nasal Cannula, Natural Airway and Simple Face Mask  Additional Equipment:   Intra-op Plan:   Post-operative Plan:   Informed Consent: I have reviewed the patients History and Physical, chart, labs and discussed the procedure including the risks, benefits and alternatives for the proposed anesthesia with the patient or authorized representative who has indicated his/her understanding and acceptance.     Dental advisory given  Plan Discussed with:   Anesthesia Plan Comments:         Anesthesia Quick Evaluation

## 2019-12-01 NOTE — Transfer of Care (Signed)
Immediate Anesthesia Transfer of Care Note  Patient: Jimmy Miller  Procedure(s) Performed: COLONOSCOPY WITH PROPOFOL (N/A ) POLYPECTOMY  Patient Location: PACU  Anesthesia Type:General  Level of Consciousness: awake, alert  and oriented  Airway & Oxygen Therapy: Patient Spontanous Breathing  Post-op Assessment: Report given to RN and Post -op Vital signs reviewed and stable  Post vital signs: Reviewed and stable  Last Vitals:  Vitals Value Taken Time  BP 131/102 12/01/19 0915  Temp 36.8 C 12/01/19 0910  Pulse 105 12/01/19 0915  Resp 21 12/01/19 0915  SpO2 94 % 12/01/19 0915    Last Pain:  Vitals:   12/01/19 0910  TempSrc:   PainSc: 0-No pain      Patients Stated Pain Goal: 7 (12/01/19 0723)  Complications: No apparent anesthesia complications

## 2019-12-01 NOTE — H&P (Signed)
@LOGO @   Primary Care Physician:  [name], MD Primary Gastroenterologist:  Dr. Gala Romney  Pre-Procedure History & Physical: HPI:  Jimmy Miller is a 44 y.o. male here for surveillance colonoscopy.  Negative colonoscopy 2015.  History of tubulovillous adenoma removed previously.  No bowel symptoms currently.  Past Medical History:  Diagnosis Date  . Asthma   . Colon polyp   . HTN (hypertension)   . Obesity, morbid, BMI 50 or higher ([place])   . Sleep apnea    not using  . Tobacco abuse   . Tourette disorder   . Ventral hernia     Past Surgical History:  Procedure Laterality Date  . ABDOMINAL HERNIA REPAIR     as a baby  . COLONOSCOPY  2011   [facility]: 30mm tubulovillous adenoma removed from rectum  . COLONOSCOPY  2008   [facility]: ICV erythematous (bx lymphoid aggregate but no active inflammation or granulomas). TRV colon polyp 8mm (features of juvenile polyp, focal non-caseating granuloma)  . COLONOSCOPY N/A 12/11/2012   Dr. Gala Romney: Diverticulosis.  Next colonoscopy 5 years    Prior to Admission medications   Medication Sig Start Date End Date Taking? Authorizing Provider  albuterol (PROVENTIL HFA;VENTOLIN HFA) 108 (90 BASE) MCG/ACT inhaler Inhale 2 puffs into the lungs every 6 (six) hours as needed for wheezing or shortness of breath. 07/05/15  Yes [name], MD  amLODipine (NORVASC) 10 MG tablet TAKE 1 TABLET BY MOUTH  DAILY FOR BLOOD PRESSURE Patient taking differently: Take 10 mg by mouth daily.  05/21/19  Yes [name], MD  atenolol (TENORMIN) 50 MG tablet Take 1 tablet (50 mg total) by mouth daily. 04/25/19  Yes [name], MD  Fluticasone-Salmeterol [facility] INHUB) 250-50 MCG/DOSE AEPB Inhale 1 puff into the lungs daily.   Yes [provider]  hydrochlorothiazide (HYDRODIURIL) 25 MG tablet TAKE 1 TABLET BY MOUTH  DAILY Patient taking differently: Take 25 mg by mouth daily.  05/21/19  Yes [name], MD   ibuprofen (ADVIL,MOTRIN) 600 MG tablet Take 1 tablet (600 mg total) by mouth 4 (four) times daily. Patient taking differently: Take 600 mg by mouth every 8 (eight) hours as needed for moderate pain.  11/07/17  Yes Bryant, [place], PA-C  KLOR-CON M20 20 MEQ tablet TAKE 1 TABLET BY MOUTH DAILY Patient taking differently: Take 20 mEq by mouth daily.  08/02/17  Yes [name], MD  polyethylene glycol-electrolytes (NULYTELY/GOLYTELY) 420 g solution As directed 06/20/19  Yes Ludwig Tugwell, Cristopher Estimable, MD  risperiDONE (RISPERDAL) 2 MG tablet TAKE 1 TABLET BY MOUTH 3  TIMES DAILY Patient taking differently: Take 2 mg by mouth 3 (three) times daily.  05/21/19  Yes [name], MD    Allergies as of 06/20/2019 - Review Complete 06/20/2019  Allergen Reaction Noted  . Benazepril Swelling 11/13/2012    Family History  Problem Relation Age of Onset  . Cancer Maternal Grandfather        unknown  . Colon cancer Neg Hx   . Liver disease Neg Hx     Social History   Socioeconomic History  . Marital status: Married    Spouse name: Not on file  . Number of children: 0  . Years of education: Not on file  . Highest education level: Not on file  Occupational History  . Occupation: disability    Employer: [employer]  Tobacco Use  . Smoking status: Current Every Day Smoker    Packs/day:  0.50    Years: 6.00    Pack years: 3.00    Types: Cigarettes  . Smokeless tobacco: Former Neurosurgeon    Quit date: 01/05/2013  Substance and Sexual Activity  . Alcohol use: Yes    Comment: 12 ounce beer occasionally  . Drug use: No  . Sexual activity: Yes  Other Topics Concern  . Not on file  Social History Narrative  . Not on file   Social Determinants of Health   Financial Resource Strain:   . Difficulty of Paying Living Expenses:   Food Insecurity:   . Worried About Programme researcher, broadcasting/film/video in the Last Year:   . Barista in the Last Year:   Transportation Needs:   . Freight forwarder  (Medical):   Marland Kitchen Lack of Transportation (Non-Medical):   Physical Activity:   . Days of Exercise per Week:   . Minutes of Exercise per Session:   Stress:   . Feeling of Stress :   Social Connections:   . Frequency of Communication with Friends and Family:   . Frequency of Social Gatherings with Friends and Family:   . Attends Religious Services:   . Active Member of Clubs or Organizations:   . Attends Banker Meetings:   Marland Kitchen Marital Status:   Intimate Partner Violence:   . Fear of Current or Ex-Partner:   . Emotionally Abused:   Marland Kitchen Physically Abused:   . Sexually Abused:     Review of Systems: See HPI, otherwise negative ROS  Physical Exam: BP 125/70   Temp 98.8 F (37.1 C) (Oral)   Resp 20   SpO2 94%  General:   Alert,  Well-developed, well-nourished, pleasant and cooperative in NAD Neck:  Supple; no masses or thyromegaly. No significant cervical adenopathy. Lungs:  Clear throughout to auscultation.   No wheezes, crackles, or rhonchi. No acute distress. Heart:  Regular rate and rhythm; no murmurs, clicks, rubs,  or gallops. Abdomen: Non-distended, normal bowel sounds.  Soft and nontender without appreciable mass or hepatosplenomegaly.  Pulses:  Normal pulses noted. Extremities:  Without clubbing or edema.  Impression/Plan: 44 year old morbidly obese gentleman with history of advanced adenoma.  Here for surveillance colonoscopy per plan.  The risks, benefits, limitations, alternatives and imponderables have been reviewed with the patient. Questions have been answered. All parties are agreeable.      Notice: This dictation was prepared with Dragon dictation along with smaller phrase technology. Any transcriptional errors that result from this process are unintentional and may not be corrected upon review.

## 2019-12-01 NOTE — Discharge Instructions (Signed)
  Colonoscopy Discharge Instructions  Read the instructions outlined below and refer to this sheet in the next few weeks. These discharge instructions provide you with general information on caring for yourself after you leave the hospital. Your doctor may also give you specific instructions. While your treatment has been planned according to the most current medical practices available, unavoidable complications occasionally occur. If you have any problems or questions after discharge, call Dr. Jena Gauss at [phone]. ACTIVITY  You may resume your regular activity, but move at a slower pace for the next 24 hours.   Take frequent rest periods for the next 24 hours.   Walking will help get rid of the air and reduce the bloated feeling in your belly (abdomen).   No driving for 24 hours (because of the medicine (anesthesia) used during the test).    Do not sign any important legal documents or operate any machinery for 24 hours (because of the anesthesia used during the test).  NUTRITION  Drink plenty of fluids.   You may resume your normal diet as instructed by your doctor.   Begin with a light meal and progress to your normal diet. Heavy or fried foods are harder to digest and may make you feel sick to your stomach (nauseated).   Avoid alcoholic beverages for 24 hours or as instructed.  MEDICATIONS  You may resume your normal medications unless your doctor tells you otherwise.  WHAT YOU CAN EXPECT TODAY  Some feelings of bloating in the abdomen.   Passage of more gas than usual.   Spotting of blood in your stool or on the toilet paper.  IF YOU HAD POLYPS REMOVED DURING THE COLONOSCOPY:  No aspirin products for 7 days or as instructed.   No alcohol for 7 days or as instructed.   Eat a soft diet for the next 24 hours.  FINDING OUT THE RESULTS OF YOUR TEST Not all test results are available during your visit. If your test results are not back during the visit, make an appointment  with your caregiver to find out the results. Do not assume everything is normal if you have not heard from your caregiver or the medical facility. It is important for you to follow up on all of your test results.  SEEK IMMEDIATE MEDICAL ATTENTION IF:  You have more than a spotting of blood in your stool.   Your belly is swollen (abdominal distention).   You are nauseated or vomiting.   You have a temperature over 101.   You have abdominal pain or discomfort that is severe or gets worse throughout the day.    Colon polyp and diverticulosis information provided-1 polyp removed from your colon today  Further recommendations to follow pending review of pathology report  At patient request, I called Candice Camp at [phone] and left a message with results

## 2019-12-02 ENCOUNTER — Encounter: Payer: Self-pay | Admitting: Internal Medicine

## 2019-12-02 LAB — SURGICAL PATHOLOGY

## 2019-12-05 ENCOUNTER — Telehealth: Payer: Self-pay | Admitting: *Deleted

## 2019-12-05 NOTE — Telephone Encounter (Signed)
Spoke with pt. Pt was notified of his TCS results. Pt should receive letter in the mail. Pt asked about his lab results that were completed on 11/28/19. Per RMR potassium needs to be addressed. Pt is taking Potassium Chloride 20 meq daily. Please advise if pt needs to increase dosage.

## 2019-12-05 NOTE — Telephone Encounter (Signed)
Pt called in and wanted to know if we can give him procedure/path results.

## 2019-12-05 NOTE — Telephone Encounter (Signed)
He is probably ok now since this is a week ago but would have him increase to KCL BID for 3 days, then go back to once daily.

## 2019-12-08 NOTE — Telephone Encounter (Signed)
Noted. Left a detailed message for pt with instructions of taking the Potassium chloride. Pt advised to call back, if needed.

## 2019-12-10 ENCOUNTER — Telehealth: Payer: Self-pay | Admitting: Family Medicine

## 2019-12-10 NOTE — Chronic Care Management (AMB) (Signed)
  Chronic Care Management   Note  12/10/2019 Name: [name] MRN: [id number] DOB: 03/19/1976  Jimmy Miller is a 44 y.o. year old male who is a primary care patient of Donita Brooks, MD. I reached out to Jimmy Miller by phone today in response to a referral sent by Mr. Malena Edman PCP, Donita Brooks, MD.   [name] was given information about Chronic Care Management services today including:  1. CCM service includes personalized support from designated clinical staff supervised by his physician, including individualized plan of care and coordination with other care providers 2. 24/7 contact phone numbers for assistance for urgent and routine care needs. 3. Service will only be billed when office clinical staff spend 20 minutes or more in a month to coordinate care. 4. Only one practitioner may furnish and bill the service in a calendar month. 5. The patient may stop CCM services at any time (effective at the end of the month) by phone call to the office staff.   Patient agreed to services and verbal consent obtained.   Follow up plan:   Myra Cody  Upstream Scheduler

## 2019-12-18 DIAGNOSIS — G4733 Obstructive sleep apnea (adult) (pediatric): Secondary | ICD-10-CM | POA: Diagnosis not present

## 2019-12-30 ENCOUNTER — Other Ambulatory Visit: Payer: Self-pay | Admitting: Family Medicine

## 2019-12-30 DIAGNOSIS — I10 Essential (primary) hypertension: Secondary | ICD-10-CM

## 2019-12-30 DIAGNOSIS — F952 Tourette's disorder: Secondary | ICD-10-CM

## 2020-01-01 ENCOUNTER — Telehealth: Payer: Medicare Other | Admitting: Pharmacist

## 2020-01-01 ENCOUNTER — Telehealth: Payer: Self-pay | Admitting: Pharmacist

## 2020-01-01 NOTE — Telephone Encounter (Signed)
  Chronic Care Management   Outreach Note  01/01/2020 Name: Jimmy Miller MRN: [id number] DOB: 08-24-1975  Referred by: Donita Brooks, MD Reason for referral : No chief complaint on file.   An unsuccessful telephone outreach was attempted today. The patient was referred to the pharmacist for assistance with care management and care coordination.   [phone] called and left VM to follow up with Chronic Care Management team for rescheduling.  Follow Up Plan:  Will contact tomorrow for rescheduling.  Willa Frater, PharmD Clinical Pharmacist [facility] [phone]

## 2020-02-17 NOTE — Chronic Care Management (AMB) (Addendum)
Chronic Care Management Pharmacy  Name: Jimmy Miller  MRN: [id number] DOB: 07-Apr-1976   Chief Complaint/ HPI  Jimmy Miller,  44 y.o. , male presents for their Initial CCM visit with the clinical pharmacist via telephone.  PCP : Jimmy Frizzle, MD  Their chronic conditions include: hypertension, asthma, obesity.  Office Visits:  04/25/2019 [name]) - follow up visit, BP elevated at visit, unsuccessful losing weight due to COVID-19 keeping him away from the gym, added atenolol 50m to his current BP meds, referred to nutrition for weight loss  Consult Visit:  06/20/2019 ([name] - history of colonic polyps, here requesting sedation for upcoming colonoscopy  Medications: Outpatient Encounter Medications as of 03/01/2020  Medication Sig   albuterol (PROVENTIL HFA;VENTOLIN HFA) 108 (90 BASE) MCG/ACT inhaler Inhale 2 puffs into the lungs every 6 (six) hours as needed for wheezing or shortness of breath.   amLODipine (NORVASC) 10 MG tablet TAKE 1 TABLET BY MOUTH  DAILY FOR BLOOD PRESSURE (Patient taking differently: Take 10 mg by mouth daily. )   atenolol (TENORMIN) 50 MG tablet Take 1 tablet (50 mg total) by mouth daily.   Fluticasone-Salmeterol (WIXELA INHUB) 250-50 MCG/DOSE AEPB Inhale 1 puff into the lungs daily.   hydrochlorothiazide (HYDRODIURIL) 25 MG tablet TAKE 1 TABLET BY MOUTH  DAILY (Patient taking differently: Take 25 mg by mouth daily. )   ibuprofen (ADVIL,MOTRIN) 600 MG tablet Take 1 tablet (600 mg total) by mouth 4 (four) times daily. (Patient taking differently: Take 600 mg by mouth every 8 (eight) hours as needed for moderate pain. )   KLOR-CON M20 20 MEQ tablet TAKE 1 TABLET BY MOUTH DAILY (Patient taking differently: Take 20 mEq by mouth daily. )   polyethylene glycol-electrolytes (NULYTELY/GOLYTELY) 420 g solution As directed   risperiDONE (RISPERDAL) 2 MG tablet TAKE 1 TABLET BY MOUTH 3  TIMES DAILY (Patient taking differently: Take 2 mg by mouth 3  (three) times daily. )   [DISCONTINUED] atenolol (TENORMIN) 50 MG tablet Take 1 tablet (50 mg total) by mouth daily.   No facility-administered encounter medications on file as of 03/01/2020.     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    Difficulty of Paying Living Expenses: Not very hard     Goals Addressed             This Visit's Progress    Pharmacy Care Plan:       CARE PLAN ENTRY  Current Barriers:  Chronic Disease Management support, education, and care coordination needs related to Hypertension, Asthma, and obesity   Hypertension Pharmacist Clinical Goal(s): Over the next 180 days, patient will work with PharmD and providers to maintain BP goal <140/90 Current regimen:  Amlodipine 149mdaily Atenolol 5030maily HCTZ 52m41mily Interventions: Reviewed patients home monitoring procedures Discussed availability of BP cuff, suggested replacing old cuff Comprehensive medications review Patient self care activities - Over the next 180 days, patient will: Check BP periodically, document, and provide at future appointments Ensure daily salt intake < 2300 mg/day  Asthma Pharmacist Clinical Goal(s) Over the next 180 days, patient will work with PharmD and providers to optimize medication and minimize symptoms related to asthma. Current regimen:  Wixela 250-50 mcg Interventions: Discussed frequency of rescue inhaler use Evaluated SOB frequency and effect on ADL's. Patient self care activities - Over the next 180 days, patient will: Continue to take medication as directed Contact providers with any increase in SOB  Obesity Pharmacist Clinical Goal(s)  Over the next 180 days, patient will work with PharmD and providers to follow plan and work towards optimal weight Current regimen:  No medications at this time Interventions: Discussed implementing exercise plan, working up to 150 minutes of exercise weekly walking at least 3 times per  week. Patient self care activities - Over the next 180 days, patient will: Implement plan  as dicussed above. Contact provider should you need suggestions on exercises or meals to reach weight loss goal.   Initial goal documentation         Asthma   Eosinophil count:   Lab Results  Component Value Date/Time   EOSPCT 3.2 04/25/2019 09:15 AM  %                               Eos (Absolute):  Lab Results  Component Value Date/Time   EOSABS 160 04/25/2019 09:15 AM    Tobacco Status:  Social History   Tobacco Use  Smoking Status Current Every Day Smoker   Packs/day: 0.50   Years: 6.00   Pack years: 3.00   Types: Cigarettes  Smokeless Tobacco Former Systems developer   Quit date: 01/05/2013    Patient has failed these meds in past: none noted Patient is currently controlled on the following medications:  Albuterol HFA 90 mcg inh Wixela Inhub 250-68mg Using maintenance inhaler regularly? Yes Frequency of rescue inhaler use:  never  Patient reports he does not ever have to use rescue inhaler even with exercise.  Copay of [place] affordable and does not ever enter donut hole.  Reports he tries to walk 3 times weekly but has been less active lately due to part time job.    Plan  Continue current medications, return to exercise 3 times per week (walking).  Work up to 150 minutes per week. ,  Hypertension   Office blood pressures are  BP Readings from Last 3 Encounters:  12/01/19 (!) 142/77  11/28/19 138/76  06/20/19 135/90    Patient has failed these meds in the past: none noted  Patient checks BP at home infrequently  Patient home BP readings are ranging: no logs available  Patient is currently controlled on the following medications:  Amlodipine 175mdaily Atenolol 5048maily HCTZ 43m28mily  Patient has cuff at home but it is not currently working, plans to get another.  Verbalizes compliance with all meds.  Counseled on BP goals and importance of home monitoring.   Exercise plan moving forward to help with BP control.  Plan  Continue current medications, implement exercise plan weekly working up to 150 minutes of exercise.     Obesity    Patient has failed these meds in past: none noted Patient is currently uncontrolled on the following medications: NONE  Patient would still benefit greatly from weight loss.  Reports exercise has trailed off since picking up part time job.  Discussed plans to start back to walking at least 3 times per week.  Seems motivated to implement plan.  Plan  Continue control with diet and exercise.  Implement above exercise plans.  Vaccines   Reviewed and discussed patient's vaccination history.    Immunization History  Administered Date(s) Administered   DTaP 04/23/1975, 06/25/1975, 12/21/1976   Hepatitis B 03/27/2013   IPV 04/23/1975, 06/25/1975, 08/25/1975, 12/21/1976   Influenza Whole 05/12/2008   Influenza,inj,Quad PF,6+ Mos 08/04/2013, 04/07/2016, 04/25/2019   MMR 05/24/1976, 03/27/2013   Td 03/27/2013   Tdap  03/27/2013  Reports both CPVOD-19 vaccines (Moderna) no issues.  Medication Management   Miscellaneous medications:  Risperidone 57m tablet tid OTC's:  IBU 6042mPatient currently uses OpArt gallery manager Phone #  ([phone] reports using no specific  method to organize medications and promote adherence. Patient denies missed doses of medication.   ChBeverly MilchPharmD Clinical Pharmacist [phone] have collaborated with the care management provider regarding care management and care coordination activities outlined in this encounter and have reviewed this encounter including documentation in the note and care plan. I am certifying that I agree with the content of this note and encounter as supervising physician.

## 2020-02-25 ENCOUNTER — Other Ambulatory Visit: Payer: Self-pay | Admitting: *Deleted

## 2020-02-25 MED ORDER — ATENOLOL 50 MG PO TABS: 50.0000 mg | ORAL_TABLET | Freq: Every day | ORAL | 0 refills | Status: DC

## 2020-03-01 ENCOUNTER — Other Ambulatory Visit: Payer: Self-pay

## 2020-03-01 ENCOUNTER — Ambulatory Visit: Payer: Medicare Other | Admitting: Pharmacist

## 2020-03-01 DIAGNOSIS — K122 Cellulitis and abscess of mouth: Secondary | ICD-10-CM | POA: Diagnosis not present

## 2020-03-01 DIAGNOSIS — J45909 Unspecified asthma, uncomplicated: Secondary | ICD-10-CM

## 2020-03-01 DIAGNOSIS — I10 Essential (primary) hypertension: Secondary | ICD-10-CM

## 2020-03-01 NOTE — Patient Instructions (Addendum)
Visit Information Thank you for meeting with me today!  I look forward to working with you to help you meet all of your healthcare goals and answer any questions you may have.  Feel free to contact me anytime!  Goals Addressed            This Visit's Progress   . Pharmacy Care Plan:       CARE PLAN ENTRY  Current Barriers:  . Chronic Disease Management support, education, and care coordination needs related to Hypertension, Asthma, and obesity   Hypertension . Pharmacist Clinical Goal(s): o Over the next 180 days, patient will work with PharmD and providers to maintain BP goal <140/90 . Current regimen:  o Amlodipine 10mg  daily o Atenolol 50mg  daily o HCTZ 25mg  daily . Interventions: o Reviewed patients home monitoring procedures o Discussed availability of BP cuff, suggested replacing old cuff o Comprehensive medications review . Patient self care activities - Over the next 180 days, patient will: o Check BP periodically, document, and provide at future appointments o Ensure daily salt intake < 2300 mg/day  Asthma . Pharmacist Clinical Goal(s) o Over the next 180 days, patient will work with PharmD and providers to optimize medication and minimize symptoms related to asthma. . Current regimen:  o Wixela 250-50 mcg . Interventions: o Discussed frequency of rescue inhaler use o Evaluated SOB frequency and effect on ADL's. . Patient self care activities - Over the next 180 days, patient will: o Continue to take medication as directed o Contact providers with any increase in SOB  Obesity . Pharmacist Clinical Goal(s) o Over the next 180 days, patient will work with PharmD and providers to follow plan and work towards optimal weight . Current regimen:  o No medications at this time . Interventions: o Discussed implementing exercise plan, working up to 150 minutes of exercise weekly walking at least 3 times per week. . Patient self care activities - Over the next 180 days,  patient will: o Implement plan  as dicussed above. o Contact provider should you need suggestions on exercises or meals to reach weight loss goal.   Initial goal documentation        Jimmy Miller was given information about Chronic Care Management services today including:  1. CCM service includes personalized support from designated clinical staff supervised by his physician, including individualized plan of care and coordination with other care providers 2. 24/7 contact phone numbers for assistance for urgent and routine care needs. 3. Standard insurance, coinsurance, copays and deductibles apply for chronic care management only during months in which we provide at least 20 minutes of these services. Most insurances cover these services at 100%, however patients may be responsible for any copay, coinsurance and/or deductible if applicable. This service may help you avoid the need for more expensive face-to-face services. 4. Only one practitioner may furnish and bill the service in a calendar month. 5. The patient may stop CCM services at any time (effective at the end of the month) by phone call to the office staff.  Patient agreed to services and verbal consent obtained.   The patient verbalized understanding of instructions provided today and agreed to receive a mailed copy of patient instruction and/or educational materials. Telephone follow up appointment with pharmacy team member scheduled for: 6 months  , PharmD Clinical Pharmacist Family Medicine [phone]   Asthma and Physical Activity Physical activity is an important part of a healthy lifestyle. If you have asthma, it  is important to exercise because physical activity can help you to:  Control your asthma.  Maintain your weight or lose weight.  Increase your energy.  Decrease stress and anxiety.  Lower your risk of getting sick.  Improve your heart health. However, asthma symptoms  can flare up when you are physically active or exercising. You can learn how to control your asthma and prevent symptoms during exercise. This will help you to remain physically active. How can asthma affect my ability to be physically active? When you have asthma, physical activity can cause you to have symptoms such as:  Wheezing. This may sound like whistling while breathing.  A feeling of tightness in the chest.  Sore throat.  Coughing.  Shortness of breath.  Tiredness (fatigue) with minimal activity.  Increased sputum production.  Chest pain. What actions can I take to prevent asthma problems during physical activity? Pulmonary rehabilitation Enroll in a pulmonary rehabilitation program. Benefits of this type of program include:  Education on lung diseases.  Classes that teach you how to exercise and be more active while decreasing your shortness of breath.  A group setting that allows you to talk with others who have asthma. Asthma action plan Follow the asthma action plan set by your health care provider. Your personal asthma plan may include:  Taking your medicines as told by your health care provider.  Avoiding your asthma triggers, except physical activity. Triggers may include cold air, dust, pollen, pet dander, and air pollution.  Tracking your asthma control.  Using a peak flow meter.  Being aware of worsening symptoms.  Knowing when to seek emergency care. Proper breathing During exercise, follow these tips for proper breathing:  Breathe in before starting the exercise and breathe out during the hardest part of the exercise.  Take slow breaths.  Pace yourself and do not try to go too fast.  While breathing out, purse your lips. Before beginning any exercise program or new activity, talk with your health care provider. Medicines If physical activity triggers your asthma, your health care provider may order the following medicines:  A rescue inhaler  (short-acting beta2-agonist) for you to use shortly before physical activity or exercise. Its effects may reduce exercise-related symptoms for 2-3 hours.  A long-acting beta2-agonist that can offer up to 12 hours of relief if taken daily.  Leukotriene modifiers. These pills are taken several hours before physical activity or exercise to help prevent asthma symptoms that are caused by exercise.  Long-term control medicines. These will be given if you have severe or frequent asthma symptoms during or after exercise. These symptoms may also mean that your asthma is not well controlled.  General information  Exercise indoors when the air is dry or during allergy season.  Try to breathe in warm, moist air by wearing a scarf over your nose and mouth or breathing only through your nose.  Spend a few minutes warming up before your workout.  Cool down after exercise. What should I do if my asthma symptoms get worse? Contact your health care provider if your asthma symptoms are getting worse. Your asthma is getting worse if:  You have symptoms more often.  Your symptoms are more severe.  Your symptoms get worse at night and make you lose sleep.  Your peak flow number is lower than your personal best or changes a lot from day to day.  Your asthma medicines do not work as well as they used to.  You use your rescue inhaler  more often. If you use your rescue inhaler more than 2 days a week, your asthma is not well controlled.  You go to the emergency room or see your health care provider because of an asthma attack. Where to find support  Ask your health care provider about signing up for a pulmonary rehabilitation program.  Ask your health care provider about asthma support groups.  Visit your local community health department.  Check out local hospitals' community health programs. Where can I get more information?  Your health care provider.  American Lung Association:  lung.org  National Heart, Lung, and Blood Institute: [place].gl Contact a health care provider if:  You have trouble walking and talking because you are out of breath. Get help right away if:  Your lips or fingernails are blue.  You are not able to breathe or catch your breath. Summary  Physical activity is an important part of a healthy lifestyle. However, if you have asthma, your symptoms can flare up during exercise or physical activity.  You can prevent problems during physical activity by doing pulmonary rehabilitation, following an asthma action plan, doing proper breathing, and using medicines.  Talk with your health care provider before starting any exercise program or new activity. This information is not intended to replace advice given to you by your health care provider. Make sure you discuss any questions you have with your health care provider. Document Revised: 11/12/2018 Document Reviewed: 10/09/2017 Elsevier Patient Education  2020 [employer].

## 2020-03-24 DIAGNOSIS — G4733 Obstructive sleep apnea (adult) (pediatric): Secondary | ICD-10-CM | POA: Diagnosis not present

## 2020-04-05 ENCOUNTER — Other Ambulatory Visit: Payer: Self-pay | Admitting: Family Medicine

## 2020-04-20 ENCOUNTER — Other Ambulatory Visit: Payer: Self-pay | Admitting: Family Medicine

## 2020-04-27 ENCOUNTER — Other Ambulatory Visit: Payer: Self-pay | Admitting: Family Medicine

## 2020-04-29 DIAGNOSIS — J45909 Unspecified asthma, uncomplicated: Secondary | ICD-10-CM | POA: Diagnosis not present

## 2020-04-29 DIAGNOSIS — Z720 Tobacco use: Secondary | ICD-10-CM | POA: Diagnosis not present

## 2020-05-12 ENCOUNTER — Other Ambulatory Visit: Payer: Self-pay

## 2020-05-12 MED ORDER — ALBUTEROL SULFATE HFA 108 (90 BASE) MCG/ACT IN AERS: 2.0000 | INHALATION_SPRAY | Freq: Four times a day (QID) | RESPIRATORY_TRACT | 2 refills | Status: DC | PRN

## 2020-05-13 ENCOUNTER — Telehealth: Payer: Self-pay | Admitting: Family Medicine

## 2020-05-13 MED ORDER — FLUTICASONE-SALMETEROL 250-50 MCG/DOSE IN AEPB: 1.0000 | INHALATION_SPRAY | Freq: Every day | RESPIRATORY_TRACT | 0 refills | Status: DC

## 2020-05-13 NOTE — Telephone Encounter (Signed)
Received a fax from [facility] [place]  Fluticasone-Salmeterol [facility] INHUB) 250-50 MCG/DOSE AEPB  [id number] - [place], [place] - [address] AT [facility]  [address] [place], [place] [address]  Phone:  [phone] Fax:  [phone]

## 2020-06-19 DIAGNOSIS — M25562 Pain in left knee: Secondary | ICD-10-CM | POA: Diagnosis not present

## 2020-06-21 ENCOUNTER — Other Ambulatory Visit: Payer: Self-pay | Admitting: Family Medicine

## 2020-07-06 ENCOUNTER — Other Ambulatory Visit: Payer: Self-pay | Admitting: Family Medicine

## 2020-07-06 MED ORDER — ATENOLOL 50 MG PO TABS: 50.0000 mg | ORAL_TABLET | Freq: Every day | ORAL | 0 refills | Status: DC

## 2020-07-06 NOTE — Telephone Encounter (Signed)
Pt called for rx refill 

## 2020-07-14 ENCOUNTER — Ambulatory Visit: Payer: Self-pay | Admitting: Pharmacist

## 2020-07-14 NOTE — Chronic Care Management (AMB) (Signed)
Chronic Care Management   Follow Up Note   07/14/2020 Name: Jimmy Miller MRN: [id number] DOB: [birth year]  Referred by: Donita Brooks, MD Reason for referral : No chief complaint on file.   Jimmy Miller is a 44 y.o. year old male who is a primary care patient of Pickard, Priscille Heidelberg, MD. The CCM team was consulted for assistance with chronic disease management and care coordination needs.    Review of patient status, including review of consultants reports, relevant laboratory and other test results, and collaboration with appropriate care team members and the patient's provider was performed as part of comprehensive patient evaluation and provision of chronic care management services.    SDOH (Social Determinants of Health) assessments performed: No See Care Plan activities for detailed interventions related to [facility])     Outpatient Encounter Medications as of 07/14/2020  Medication Sig  . potassium chloride SA (KLOR-CON) 20 MEQ tablet TAKE 1 TABLET BY MOUTH  DAILY  . albuterol (VENTOLIN HFA) 108 (90 Base) MCG/ACT inhaler Inhale 2 puffs into the lungs every 6 (six) hours as needed for wheezing or shortness of breath.  Marland Kitchen amLODipine (NORVASC) 10 MG tablet Take 1 tablet (10 mg total) by mouth daily.  Marland Kitchen atenolol (TENORMIN) 50 MG tablet Take 1 tablet (50 mg total) by mouth daily.  . hydrochlorothiazide (HYDRODIURIL) 25 MG tablet Take 1 tablet (25 mg total) by mouth daily.  Marland Kitchen ibuprofen (ADVIL,MOTRIN) 600 MG tablet Take 1 tablet (600 mg total) by mouth 4 (four) times daily. (Patient taking differently: Take 600 mg by mouth every 8 (eight) hours as needed for moderate pain. )  . polyethylene glycol-electrolytes (NULYTELY/GOLYTELY) 420 g solution As directed  . risperiDONE (RISPERDAL) 2 MG tablet Take 1 tablet (2 mg total) by mouth 3 (three) times daily.  Monte Fantasia INHUB 250-50 MCG/DOSE AEPB USE 1 INHALATION BY MOUTH  TWICE DAILY   No facility-administered encounter medications on file as  of 07/14/2020.     Reviewed chart prior to disease state call. Spoke with patient regarding BP No Ov's since last CCM visit with Pharmacist.  Recent Office Vitals: BP Readings from Last 3 Encounters:  12/01/19 (!) 142/77  11/28/19 138/76  06/20/19 135/90   Pulse Readings from Last 3 Encounters:  12/01/19 93  11/28/19 84  06/20/19 87    Wt Readings from Last 3 Encounters:  11/28/19 (!) 425 lb (192.8 kg)  06/20/19 (!) 440 lb 9.6 oz (199.9 kg)  06/10/19 (!) 436 lb (197.8 kg)     Kidney Function Lab Results  Component Value Date/Time   CREATININE 0.97 11/28/2019 10:28 AM   CREATININE 1.02 04/25/2019 09:15 AM   CREATININE 1.11 10/12/2017 08:28 AM   GFRNONAA >60 11/28/2019 10:28 AM   GFRNONAA 89 04/25/2019 09:15 AM   GFRAA >60 11/28/2019 10:28 AM   GFRAA 103 04/25/2019 09:15 AM    BMP Latest Ref Rng & Units 11/28/2019 04/25/2019 10/12/2017  Glucose 70 - 99 mg/dL 94 93 92  BUN 6 - 20 mg/dL 14 15 11   Creatinine 0.61 - 1.24 mg/dL 0.16 0.10  BUN/Creat Ratio 6 - 22 (calc) - NOT APPLICABLE NOT APPLICABLE  Sodium 135 - 145 mmol/L 139 139 139  Potassium 3.5 - 5.1 mmol/L 3.3(L) 3.7 4.0  Chloride 98 - 111 mmol/L 102 103 101  CO2 22 - 32 mmol/L 27 26 31   Calcium 8.9 - 10.3 mg/dL 9.32) 9.1 9.1    . Current antihypertensive regimen:  o Amlodipine 10mg  o Atenolol 50mg   o HCTZ 25mg  . How often are you checking your Blood Pressure? infrequently . Current home BP readings: He has not checked lately but remembers it being "normal" last time . What recent interventions/DTPs have been made by any provider to improve Blood Pressure control since last CPP Visit:  o None . Any recent hospitalizations or ED visits since last visit with CPP? No . What diet changes have been made to improve Blood Pressure Control?  o Patient is cutting back on all meats, he is only eating seafood now for meats.  He is also not eating anymore fried foods and watching salt intake. . What exercise is being  done to improve your Blood Pressure Control?  o Patient reports he is doing some walking  Adherence Review: Is the patient currently on ACE/ARB medication? No Does the patient have >5 day gap between last estimated fill dates? No  He reported some difficulty getting his medications at his mail order pharmacy last month.  Reminded him of my direct line to contact me should he ever need help getting his medications.  Encouraged him to continue his progress on diet/exercise.  Also reminded him of upcoming visit with me over the telephone in January for a complete medication review.  February, PharmD Clinical Pharmacist [facility] Medicine [phone]

## 2020-07-15 DIAGNOSIS — G4733 Obstructive sleep apnea (adult) (pediatric): Secondary | ICD-10-CM | POA: Diagnosis not present

## 2020-08-10 DIAGNOSIS — M25462 Effusion, left knee: Secondary | ICD-10-CM | POA: Diagnosis not present

## 2020-08-23 NOTE — Chronic Care Management (AMB) (Signed)
Chronic Care Management Pharmacy  Name: Jimmy Miller  MRN: [id number] DOB: 02/09/1976   Chief Complaint/ HPI  Jimmy Miller,  45 y.o. , male presents for their Follow-Up CCM visit with the clinical pharmacist via telephone.  PCP : [name], MD  Their chronic conditions include: hypertension, asthma, obesity.  Office Visits:  04/25/2019 [name]) - follow up visit, BP elevated at visit, unsuccessful losing weight due to COVID-19 keeping him away from the gym, added atenolol 2m to his current BP meds, referred to nutrition for weight loss  Consult Visit:  06/20/2019 ([name] - history of colonic polyps, here requesting sedation for upcoming colonoscopy  Medications: Outpatient Encounter Medications as of 09/03/2020  Medication Sig  . albuterol (VENTOLIN HFA) 108 (90 Base) MCG/ACT inhaler Inhale 2 puffs into the lungs every 6 (six) hours as needed for wheezing or shortness of breath.  .Marland KitchenamLODipine (NORVASC) 10 MG tablet Take 1 tablet (10 mg total) by mouth daily.  .Marland Kitchenatenolol (TENORMIN) 50 MG tablet Take 1 tablet (50 mg total) by mouth daily.  . hydrochlorothiazide (HYDRODIURIL) 25 MG tablet Take 1 tablet (25 mg total) by mouth daily.  .Marland Kitchenibuprofen (ADVIL,MOTRIN) 600 MG tablet Take 1 tablet (600 mg total) by mouth 4 (four) times daily. (Patient taking differently: Take 600 mg by mouth every 8 (eight) hours as needed for moderate pain.)  . polyethylene glycol-electrolytes (NULYTELY/GOLYTELY) 420 g solution As directed  . potassium chloride SA (KLOR-CON) 20 MEQ tablet TAKE 1 TABLET BY MOUTH  DAILY  . risperiDONE (RISPERDAL) 2 MG tablet Take 1 tablet (2 mg total) by mouth 3 (three) times daily.  .Grant RutsINHUB 250-50 MCG/DOSE AEPB USE 1 INHALATION BY MOUTH  TWICE DAILY   No facility-administered encounter medications on file as of 09/03/2020.     Current Diagnosis/Assessment: FEmergency planning/management officerStrain: Low Risk   . Difficulty of Paying Living Expenses: Not very  hard     Goals Addressed            This Visit's Progress   . Pharmacy Care Plan:       CARE PLAN ENTRY  Current Barriers:  . Chronic Disease Management support, education, and care coordination needs related to Hypertension, Asthma, and obesity   Hypertension . Pharmacist Clinical Goal(s): o Over the next 180 days, patient will work with PharmD and providers to maintain BP goal <140/90 . Current regimen:  o Amlodipine 116mdaily o Atenolol 5052maily o HCTZ 40m30mily . Interventions: o Reviewed patients home monitoring procedures o Recommended monitoring at home with new cuff o Recommended physical activity with new stationary bike . Patient self care activities - Over the next 180 days, patient will: o Check BP periodically, document, and provide at future appointments o Ensure daily salt intake < 2300 mg/day o Use bike 3 times weekly and work way up to 5 times per week as tolerated  Asthma . Pharmacist Clinical Goal(s) o Over the next 180 days, patient will work with PharmD and providers to optimize medication and minimize symptoms related to asthma. . Current regimen:  o Wixela 250-50 mcg . Interventions: o Discussed frequency of rescue inhaler use o Evaluated SOB frequency and effect on ADL's. . Patient self care activities - Over the next 180 days, patient will: o Continue to take medication as directed o Contact providers with any increase in SOB  Obesity . Pharmacist Clinical Goal(s) o Over the next 180 days, patient will work with PharmD and providers to  follow plan and work towards optimal weight . Current regimen:  o No medications at this time . Interventions: o Discussed implementing exercise plan, working up to 150 minutes of exercise weekly walking at least 3 times per week. . Patient self care activities - Over the next 180 days, patient will: o Implement plan  as dicussed above. o Contact provider should you need suggestions on exercises or meals  to reach weight loss goal. o Work on exercise plan as above   Please see past updates related to this goal by clicking on the "Past Updates" button in the selected goal         Asthma   Eosinophil count:   Lab Results  Component Value Date/Time   EOSPCT 3.2 04/25/2019 09:15 AM  %                               Eos (Absolute):  Lab Results  Component Value Date/Time   EOSABS 160 04/25/2019 09:15 AM    Tobacco Status:  Social History   Tobacco Use  Smoking Status Current Every Day Smoker  . Packs/day: 0.50  . Years: 6.00  . Pack years: 3.00  . Types: Cigarettes  Smokeless Tobacco Former Systems developer  . Quit date: 01/05/2013    Patient has failed these meds in past: none noted Patient is currently controlled on the following medications:   Albuterol HFA 90 mcg inh  Wixela Inhub 250-30mg Using maintenance inhaler regularly? Yes Frequency of rescue inhaler use:  never  Patient reports he does not ever have to use rescue inhaler even with exercise.  Copay of WGrant Rutsis affordable and does not ever enter donut hole.  Reports he tries to walk 3 times weekly but has been less active lately due to part time job.    Update 09/03/20 Picking up a stationary bike from a gym getting rid of equipment He plans to start riding 3 times per week and work his way up to 5 days weekly Reports minimal use of rescue inhaler Wixela copay affordable, denies any recent SOB Uses appropriately as a daily maintenance  Plan  Continue current medications  Implement PA plan with stationary bike  Hypertension   Office blood pressures are  BP Readings from Last 3 Encounters:  12/01/19 (!) 142/77  11/28/19 138/76  06/20/19 135/90    Patient has failed these meds in the past: none noted  Patient checks BP at home infrequently  Patient home BP readings are ranging: no logs available  Patient is currently controlled on the following medications:   Amlodipine 167mdaily  Atenolol 5063mdaily  HCTZ 32m10mily  Patient has cuff at home but it is not currently working, plans to get another.  Verbalizes compliance with all meds.  Counseled on BP goals and importance of home monitoring.  Exercise plan moving forward to help with BP control.  Update 09/03/20 No specific readings, he did just get a blood pressure monitor delivered today Have asked him to monitor 1-2 times per week and record Discussed BP goal < 140/90 and to notify us [place] was consistently above this Plan  Continue current medications  Exercise plan, initiate home monitoring   Obesity    Patient has failed these meds in past: none noted Patient is currently uncontrolled on the following medications: NONE  Patient would still benefit greatly from weight loss.  Reports exercise has trailed off since picking up part  time job.  Discussed plans to start back to walking at least 3 times per week.  Seems motivated to implement plan.  Plan  Continue control with diet and exercise.  Implement above exercise plans.  Vaccines   Reviewed and discussed patient's vaccination history.    Immunization History  Administered Date(s) Administered  . DTaP 04/23/1975, 06/25/1975, 12/21/1976  . Hepatitis B 03/27/2013  . IPV 04/23/1975, 06/25/1975, 08/25/1975, 12/21/1976  . Influenza Whole 05/12/2008  . Influenza,inj,Quad PF,6+ Mos 08/04/2013, 04/07/2016, 04/25/2019  . MMR 05/24/1976, 03/27/2013  . Moderna Sars-Covid-2 Vaccination 06/25/2020  . Td 03/27/2013  . Tdap 03/27/2013  Reports both COVID-19 vaccines (Moderna) no issues.  Medication Management   . Miscellaneous medications:  o Risperidone 27m tablet tid . OTC's:  o IBU 6061m. Patient currently uses OpArt gallery manager Phone #  [phone] Patient reports using no specific  method to organize medications and promote adherence. . Patient denies missed doses of medication.   ChBeverly MilchPharmD Clinical Pharmacist [phone]

## 2020-09-03 ENCOUNTER — Other Ambulatory Visit: Payer: Self-pay

## 2020-09-03 ENCOUNTER — Ambulatory Visit: Payer: Medicare Other

## 2020-09-03 DIAGNOSIS — J45909 Unspecified asthma, uncomplicated: Secondary | ICD-10-CM

## 2020-09-03 DIAGNOSIS — I10 Essential (primary) hypertension: Secondary | ICD-10-CM

## 2020-09-03 NOTE — Patient Instructions (Addendum)
Visit Information  Goals Addressed            This Visit's Progress   . Pharmacy Care Plan:       CARE PLAN ENTRY  Current Barriers:  . Chronic Disease Management support, education, and care coordination needs related to Hypertension, Asthma, and obesity   Hypertension . Pharmacist Clinical Goal(s): o Over the next 180 days, patient will work with PharmD and providers to maintain BP goal <140/90 . Current regimen:  o Amlodipine 10mg  daily o Atenolol 50mg  daily o HCTZ 25mg  daily . Interventions: o Reviewed patients home monitoring procedures o Recommended monitoring at home with new cuff o Recommended physical activity with new stationary bike . Patient self care activities - Over the next 180 days, patient will: o Check BP periodically, document, and provide at future appointments o Ensure daily salt intake < 2300 mg/day o Use bike 3 times weekly and work way up to 5 times per week as tolerated  Asthma . Pharmacist Clinical Goal(s) o Over the next 180 days, patient will work with PharmD and providers to optimize medication and minimize symptoms related to asthma. . Current regimen:  o Wixela 250-50 mcg . Interventions: o Discussed frequency of rescue inhaler use o Evaluated SOB frequency and effect on ADL's. . Patient self care activities - Over the next 180 days, patient will: o Continue to take medication as directed o Contact providers with any increase in SOB  Obesity . Pharmacist Clinical Goal(s) o Over the next 180 days, patient will work with PharmD and providers to follow plan and work towards optimal weight . Current regimen:  o No medications at this time . Interventions: o Discussed implementing exercise plan, working up to 150 minutes of exercise weekly walking at least 3 times per week. . Patient self care activities - Over the next 180 days, patient will: o Implement plan  as dicussed above. o Contact provider should you need suggestions on exercises  or meals to reach weight loss goal. o Work on exercise plan as above   Please see past updates related to this goal by clicking on the "Past Updates" button in the selected goal         The patient verbalized understanding of instructions, educational materials, and care plan provided today and agreed to receive a mailed copy of patient instructions, educational materials, and care plan.   Telephone follow up appointment with pharmacy team member scheduled for: 6 months  , [facility]  BMI for Adults What is BMI? Body mass index (BMI) is a number that is calculated from a person's weight and height. BMI can help estimate how much of a person's weight is composed of fat. BMI does not measure body fat directly. Rather, it is an alternative to procedures that directly measure body fat, which can be difficult and expensive. BMI can help identify people who may be at higher risk for certain medical problems. What are BMI measurements used for? BMI is used as a screening tool to identify possible weight problems. It helps determine whether a person is obese, overweight, a healthy weight, or underweight. BMI is useful for:  Identifying a weight problem that may be related to a medical condition or may increase the risk for medical problems.  Promoting changes, such as changes in diet and exercise, to help reach a healthy weight. BMI screening can be repeated to see if these changes are working. How is BMI calculated? BMI involves measuring your weight in relation  to your height. Both height and weight are measured, and the BMI is calculated from those numbers. This can be done either in [place] ([place] or metric measurements. Note that charts and online BMI calculators are available to help you find your BMI quickly and easily without having to do these calculations yourself. To calculate your BMI in English (U.[place] measurements: 1. Measure your weight in pounds (lb). 2. Multiply the  number of pounds by 703. ? For example, for a person who weighs 180 lb, multiply that number by 703, which equals 126,540. 3. Measure your height in inches. Then multiply that number by itself to get a measurement called "inches squared." ? For example, for a person who is 70 inches tall, the "inches squared" measurement is 70 inches x 70 inches, which equals 4,900 inches squared. 4. Divide the total from step 2 (number of lb x 703) by the total from step 3 (inches squared): 126,540  4,900 = 25.8. This is your BMI.   To calculate your BMI in metric measurements: 1. Measure your weight in kilograms (kg). 2. Measure your height in meters (m). Then multiply that number by itself to get a measurement called "meters squared." ? For example, for a person who is 1.75 m tall, the "meters squared" measurement is 1.75 m x 1.75 m, which is equal to 3.1 meters squared. 3. Divide the number of kilograms (your weight) by the meters squared number. In this example: 70  3.1 = 22.6. This is your BMI. What do the results mean? BMI charts are used to identify whether you are underweight, normal weight, overweight, or obese. The following guidelines will be used:  Underweight: BMI less than 18.5.  Normal weight: BMI between 18.5 and 24.9.  Overweight: BMI between 25 and 29.9.  Obese: BMI of 30 or above. Keep these notes in mind:  Weight includes both fat and muscle, so someone with a muscular build, such as an athlete, may have a BMI that is higher than 24.9. In cases like these, BMI is not an accurate measure of body fat.  To determine if excess body fat is the cause of a BMI of 25 or higher, further assessments may need to be done by a health care provider.  BMI is usually interpreted in the same way for men and women. Where to find more information For more information about BMI, including tools to quickly calculate your BMI, go to these websites:  Centers for Disease Control and Prevention:  FootballExhibition.com.br  American Heart Association: www.heart.org  National Heart, Lung, and Blood Institute: PopSteam.is Summary  Body mass index (BMI) is a number that is calculated from a person's weight and height.  BMI may help estimate how much of a person's weight is composed of fat. BMI can help identify those who may be at higher risk for certain medical problems.  BMI can be measured using English measurements or metric measurements.  BMI charts are used to identify whether you are underweight, normal weight, overweight, or obese. This information is not intended to replace advice given to you by your health care provider. Make sure you discuss any questions you have with your health care provider. Document Revised: 04/16/2019 Document Reviewed: 02/21/2019 Elsevier Patient Education  2021 [employer].

## 2020-09-06 ENCOUNTER — Other Ambulatory Visit: Payer: Self-pay | Admitting: Family Medicine

## 2020-09-06 ENCOUNTER — Telehealth: Payer: Self-pay | Admitting: Pharmacist

## 2020-09-06 NOTE — Progress Notes (Signed)
° ° °  Chronic Care Management Pharmacy Assistant   Name: Jimmy Miller  MRN: [id number] DOB: 1975-10-12  Reason for Encounter: Adherence Review  PCP : [name], MD   Verified Adherence Gap Information. Per insurance data, patient has not met their annual wellness or wellness bundle screening. Their most recent blood pressure was 135/90 on 06/20/2019.The patients total gaps-all measures was equal to 3.  Follow-Up:  Pharmacist Review   Charlann Lange, [place] Pharmacist Assistant [phone]

## 2020-09-21 ENCOUNTER — Other Ambulatory Visit: Payer: Self-pay | Admitting: Family Medicine

## 2020-10-06 ENCOUNTER — Other Ambulatory Visit: Payer: Self-pay | Admitting: Family Medicine

## 2020-10-06 DIAGNOSIS — J309 Allergic rhinitis, unspecified: Secondary | ICD-10-CM | POA: Diagnosis not present

## 2020-10-06 NOTE — Addendum Note (Signed)
Addended by: Shon Hale on: 10/06/2020 05:07 PM   Modules accepted: Orders

## 2020-10-07 MED ORDER — RISPERIDONE 2 MG PO TABS: 2.0000 mg | ORAL_TABLET | Freq: Three times a day (TID) | ORAL | 11 refills | Status: DC

## 2020-10-20 DIAGNOSIS — G4733 Obstructive sleep apnea (adult) (pediatric): Secondary | ICD-10-CM | POA: Diagnosis not present

## 2020-11-10 ENCOUNTER — Telehealth: Payer: Self-pay | Admitting: Pharmacist

## 2020-11-10 NOTE — Progress Notes (Addendum)
    Chronic Care Management Pharmacy Assistant   Name: Jimmy Miller  MRN: [id number] DOB: 1976/04/01  Reason for Encounter: General Disease State Call   Conditions to be addressed/monitored: hypertension, asthma, obesity.  Recent office visits:  None since 09/06/20  Recent consult visits:  None since 09/06/20  Hospital visits:  None in previous 6 months  Medications: Outpatient Encounter Medications as of 11/10/2020  Medication Sig   albuterol (VENTOLIN HFA) 108 (90 Base) MCG/ACT inhaler Inhale 2 puffs into the lungs every 6 (six) hours as needed for wheezing or shortness of breath.   amLODipine (NORVASC) 10 MG tablet TAKE 1 TABLET BY MOUTH  DAILY   atenolol (TENORMIN) 50 MG tablet TAKE 1 TABLET BY MOUTH  DAILY   hydrochlorothiazide (HYDRODIURIL) 25 MG tablet TAKE 1 TABLET BY MOUTH  DAILY   ibuprofen (ADVIL,MOTRIN) 600 MG tablet Take 1 tablet (600 mg total) by mouth 4 (four) times daily. (Patient taking differently: Take 600 mg by mouth every 8 (eight) hours as needed for moderate pain.)   polyethylene glycol-electrolytes (NULYTELY/GOLYTELY) 420 g solution As directed   potassium chloride SA (KLOR-CON) 20 MEQ tablet TAKE 1 TABLET BY MOUTH  DAILY   risperiDONE (RISPERDAL) 2 MG tablet Take 1 tablet (2 mg total) by mouth 3 (three) times daily.   WIXELA INHUB 250-50 MCG/DOSE AEPB USE 1 INHALATION BY MOUTH  TWICE DAILY   No facility-administered encounter medications on file as of 11/10/2020.   GEN CALL:  Patient stated he works out about 1-2 a week for about 15 minutes. He stated he is going back to [facility] soon because he has a membership with them now. He stated he has started to cut back on his food intake. He stated he brought some protein powder and is going to start drinking fruit shakes in the morning. He stated he does not go to fast food restaurants anymore. The patient and I spoke about good ideas on how to eat better and he voiced understanding.   Star Rating Drugs:  None.  Follow-Up:Pharmacist Review  Hulen Luster, RMA Clinical Pharmacist Assistant [phone]  5 minutes spent in review, coordination, and documentation.  Reviewed by: Willa Frater, PharmD Clinical Pharmacist [facility] [phone]

## 2020-11-18 ENCOUNTER — Telehealth: Payer: Self-pay | Admitting: *Deleted

## 2020-11-18 MED ORDER — FLUTICASONE-SALMETEROL 250-50 MCG/DOSE IN AEPB: INHALATION_SPRAY | RESPIRATORY_TRACT | 0 refills | Status: DC

## 2020-11-18 MED ORDER — RISPERIDONE 2 MG PO TABS: 2.0000 mg | ORAL_TABLET | Freq: Three times a day (TID) | ORAL | 0 refills | Status: AC

## 2020-11-18 NOTE — Telephone Encounter (Signed)
-----   Message from Erroll Luna, [facility] sent at 11/18/2020  2:48 PM EDT ----- Patient called asking for refills on Risperidone and Wixela inhaler sent to [facility] in [place] because he is having a delay from mail order and is currently out of both of these.  Thanks!  Willa Frater, PharmD Clinical Pharmacist [facility] [phone]

## 2020-11-18 NOTE — Telephone Encounter (Signed)
Medication filled x1 with no refills.   Requires office visit before any further refills can be given.   Letter sent.  

## 2020-12-12 ENCOUNTER — Other Ambulatory Visit: Payer: Self-pay | Admitting: Family Medicine

## 2021-01-04 ENCOUNTER — Other Ambulatory Visit: Payer: Medicare Other

## 2021-01-06 ENCOUNTER — Ambulatory Visit: Payer: Medicare Other | Admitting: Family Medicine

## 2021-01-18 DIAGNOSIS — G4733 Obstructive sleep apnea (adult) (pediatric): Secondary | ICD-10-CM | POA: Diagnosis not present

## 2021-02-04 ENCOUNTER — Other Ambulatory Visit: Payer: Self-pay | Admitting: Family Medicine

## 2021-02-04 DIAGNOSIS — Z7689 Persons encountering health services in other specified circumstances: Secondary | ICD-10-CM | POA: Diagnosis not present

## 2021-02-04 DIAGNOSIS — Z131 Encounter for screening for diabetes mellitus: Secondary | ICD-10-CM | POA: Diagnosis not present

## 2021-02-04 DIAGNOSIS — Z13220 Encounter for screening for lipoid disorders: Secondary | ICD-10-CM | POA: Diagnosis not present

## 2021-02-04 DIAGNOSIS — J45909 Unspecified asthma, uncomplicated: Secondary | ICD-10-CM | POA: Diagnosis not present

## 2021-02-04 DIAGNOSIS — I10 Essential (primary) hypertension: Secondary | ICD-10-CM | POA: Diagnosis not present

## 2021-02-28 ENCOUNTER — Telehealth: Payer: Self-pay

## 2021-03-04 ENCOUNTER — Telehealth: Payer: Self-pay

## 2021-03-10 ENCOUNTER — Ambulatory Visit (INDEPENDENT_AMBULATORY_CARE_PROVIDER_SITE_OTHER): Payer: Medicare Other | Admitting: Pharmacist

## 2021-03-10 DIAGNOSIS — I10 Essential (primary) hypertension: Secondary | ICD-10-CM

## 2021-03-10 DIAGNOSIS — J45909 Unspecified asthma, uncomplicated: Secondary | ICD-10-CM | POA: Diagnosis not present

## 2021-03-10 NOTE — Progress Notes (Signed)
Chronic Care Management Pharmacy Note  03/10/2021 Name:  Jimmy Miller MRN:  [id number] DOB:  1976-03-07  Summary: Pharmd follow up.  Patient working on lifestyle mods.  He no longer eats mea, goal is to be vegan - he reports 15-20 lb. Weight loss recently.  BP continues to be normal at home.  Recommendations/Changes made from today's visit: Incorporate exercise into daily routine  Plan: FU 6 months   Subjective: Jimmy Miller is an 45 y.o. year old male who is a primary patient of Pickard, Cammie Mcgee, MD.  The CCM team was consulted for assistance with disease management and care coordination needs.    Engaged with patient by telephone for follow up visit in response to provider referral for pharmacy case management and/or care coordination services.   Consent to Services:  The patient was given the following information about Chronic Care Management services today, agreed to services, and gave verbal consent: 1. CCM service includes personalized support from designated clinical staff supervised by the primary care provider, including individualized plan of care and coordination with other care providers 2. 24/7 contact phone numbers for assistance for urgent and routine care needs. 3. Service will only be billed when office clinical staff spend 20 minutes or more in a month to coordinate care. 4. Only one practitioner may furnish and bill the service in a calendar month. 5.The patient may stop CCM services at any time (effective at the end of the month) by phone call to the office staff. 6. The patient will be responsible for cost sharing (co-pay) of up to 20% of the service fee (after annual deductible is met). Patient agreed to services and consent obtained.  Patient Care Team: [name], MD as PCP - General (Family Medicine) Edythe Clarity, [facility] as Pharmacist (Pharmacist)  Recent office visits: None since last CCM call  Recent consult visits: None since last CCM  call  Hospital visits: None in previous 6 months   Objective:  Lab Results  Component Value Date   CREATININE 0.97 11/28/2019   BUN 14 11/28/2019   GFRNONAA >60 11/28/2019   GFRAA >60 11/28/2019   NA 139 11/28/2019   K 3.3 (L) 11/28/2019   CALCIUM 8.7 (L) 11/28/2019   CO2 27 11/28/2019   GLUCOSE 94 11/28/2019    No results found for: HGBA1C, FRUCTOSAMINE, GFR, MICROALBUR  Last diabetic Eye exam: No results found for: HMDIABEYEEXA  Last diabetic Foot exam: No results found for: HMDIABFOOTEX   Lab Results  Component Value Date   CHOL 172 04/25/2019   HDL 46 04/25/2019   LDLCALC 111 (H) 04/25/2019   TRIG 64 04/25/2019   CHOLHDL 3.7 04/25/2019    Hepatic Function Latest Ref Rng & Units 04/25/2019 10/12/2017 04/07/2016  Total Protein 6.1 - 8.1 g/dL 7.1 6.9 7.2  Albumin 3.6 - 5.1 g/dL - - 4.2  AST 10 - 40 U/L 19 20 23   ALT 9 - 46 U/L 29 31 33  Alk Phosphatase 40 - 115 U/L - - 122(H)  Total Bilirubin 0.2 - 1.2 mg/dL 0.6 0.7 0.8  Bilirubin, Direct 0.0 - 0.3 mg/dL - - -    Lab Results  Component Value Date/Time   TSH 1.73 08/28/2012 12:00 AM   TSH 1.715 08/29/2008 05:54 AM   TSH 2.459 06/18/2007 08:11 PM    CBC Latest Ref Rng & Units 04/25/2019 10/12/2017 04/07/2016  WBC 3.8 - 10.8 Thousand/uL 5.0 5.0 4.8  Hemoglobin 13.2 - 17.1 g/dL 13.8 13.5 14.3  Hematocrit 38.5 - 50.0 % 41.8 40.0 42.4  Platelets 140 - 400 Thousand/uL 190 200 212    No results found for: VD25OH  Clinical ASCVD: No  The ASCVD Risk score [name], et al., 2013) failed to calculate for the following reasons:   The systolic blood pressure is missing    Depression screen [facility] 2/9 06/10/2019 10/12/2017  Decreased Interest 0 0  Down, Depressed, Hopeless 0 0  PHQ - 2 Score 0 0     Social History   Tobacco Use  Smoking Status Every Day   Packs/day: 0.50   Years: 6.00   Pack years: 3.00   Types: Cigarettes  Smokeless Tobacco Former   Quit date: 01/05/2013   BP Readings from Last 3 Encounters:   12/01/19 (!) 142/77  11/28/19 138/76  06/20/19 135/90   Pulse Readings from Last 3 Encounters:  12/01/19 93  11/28/19 84  06/20/19 87   Wt Readings from Last 3 Encounters:  11/28/19 (!) 425 lb (192.8 kg)  06/20/19 (!) 440 lb 9.6 oz (199.9 kg)  06/10/19 (!) 436 lb (197.8 kg)   BMI Readings from Last 3 Encounters:  11/28/19 56.07 kg/m  06/20/19 58.13 kg/m  06/10/19 57.52 kg/m    Assessment/Interventions: Review of patient past medical history, allergies, medications, health status, including review of consultants reports, laboratory and other test data, was performed as part of comprehensive evaluation and provision of chronic care management services.   SDOH:  (Social Determinants of Health) assessments and interventions performed: Yes  Financial Resource Strain: Not on file    SDOH Screenings   Alcohol Screen: Not on file  Depression (PHQ2-9): Not on file  Financial Resource Strain: Not on file  Food Insecurity: Not on file  Housing: Not on file  Physical Activity: Not on file  Social Connections: Not on file  Stress: Not on file  Tobacco Use: Not on file  Transportation Needs: Not on file    CCM Care Plan  Allergies  Allergen Reactions   Benazepril Swelling    Mouth swelling    Medications Reviewed Today     Reviewed by Edythe Clarity, [facility] (Pharmacist) on 09/03/20 at 1246  Med List Status: <None>   Medication Order Taking? Sig Documenting Provider Last Dose Status Informant  albuterol (VENTOLIN HFA) 108 (90 Base) MCG/ACT inhaler [id number] Yes Inhale 2 puffs into the lungs every 6 (six) hours as needed for wheezing or shortness of breath. [name], MD Taking Active   amLODipine (NORVASC) 10 MG tablet [id number] Yes Take 1 tablet (10 mg total) by mouth daily. [name], MD Taking Active   atenolol (TENORMIN) 50 MG tablet [id number] Yes Take 1 tablet (50 mg total) by mouth daily. [name], MD Taking Active   hydrochlorothiazide  (HYDRODIURIL) 25 MG tablet [id number] Yes Take 1 tablet (25 mg total) by mouth daily. [name], MD Taking Active   ibuprofen (ADVIL,MOTRIN) 600 MG tablet [id number] Yes Take 1 tablet (600 mg total) by mouth 4 (four) times daily.  Patient taking differently: Take 600 mg by mouth every 8 (eight) hours as needed for moderate pain.   Lily Kocher, PA-C Taking Active Self  polyethylene glycol-electrolytes (NULYTELY/GOLYTELY) 420 g solution [id number] Yes As directed Rourk, Cristopher Estimable, MD Taking Active Self  potassium chloride SA (KLOR-CON) 20 MEQ tablet [id number] Yes TAKE 1 TABLET BY MOUTH  DAILY [name], MD Taking Active   risperiDONE (RISPERDAL) 2 MG tablet [id number] Yes Take 1 tablet (2  mg total) by mouth 3 (three) times daily. [name], MD Taking Active   WIXELA INHUB 250-50 Earlie Raveling [id number] Yes USE 1 INHALATION BY MOUTH  TWICE DAILY [name], MD Taking Active             Patient Active Problem List   Diagnosis Date Noted   Obesity, morbid, BMI 50 or higher ([place])    Personal history of colonic polyps 11/13/2012   Abnormal LFTs 11/13/2012   Essential hypertension, benign 07/18/2007   MORBID OBESITY 06/17/2007   TOURETTE'S DISORDER 06/17/2007   Asthma 06/17/2007    Immunization History  Administered Date(s) Administered   DTaP 04/23/1975, 06/25/1975, 12/21/1976   Hepatitis B 03/27/2013   IPV 04/23/1975, 06/25/1975, 08/25/1975, 12/21/1976   Influenza Whole 05/12/2008   Influenza,inj,Quad PF,6+ Mos 08/04/2013, 04/07/2016, 04/25/2019   MMR 05/24/1976, 03/27/2013   Moderna Sars-Covid-2 Vaccination 06/25/2020   Td 03/27/2013   Tdap 03/27/2013    Conditions to be addressed/monitored:  Hypertension and Asthma  Care Plan : General Pharmacy (Adult)  Updates made by Edythe Clarity, RPH since 03/10/2021 12:00 AM     Problem: HTN, Asthma   Priority: High  Onset Date: 03/10/2021     Long-Range Goal: Patient-Specific Goal   Start Date:  03/10/2021  Expected End Date: 09/10/2021  This Visit's Progress: On track  Priority: High  Note:   Current Barriers:  None at this time  Pharmacist Clinical Goal(s):  Patient will maintain control of BP as evidenced by home monitoring  Work to incorporate exercise into lifestyle mods he is currently working on through collaboration with PharmD and provider.   Interventions: 1:1 collaboration with [name], MD regarding development and update of comprehensive plan of care as evidenced by provider attestation and co-signature Inter-disciplinary care team collaboration (see longitudinal plan of care) Comprehensive medication review performed; medication list updated in electronic medical record  Hypertension  (Status:Goal on track: YES.)   Med Management Intervention: Reviewed home monitoring  (BP goal <140/90) -Controlled -Current treatment: HCTZ 60m daily Atenolol 527mdaily Amlodipine 1079maily -Medications previously tried: none noted  -Current home readings: "normal" -Current dietary habits: patient does not eat any meats anymore, his goal is to go full vegan -Current exercise habits: none currently - plans to start doing a stepper that he can do inside the house where he puts it on the floor -Denies hypotensive/hypertensive symptoms -Educated on BP goals and benefits of medications for prevention of heart attack, stroke and kidney damage; Exercise goal of 150 minutes per week; Importance of home blood pressure monitoring; Symptoms of hypotension and importance of maintaining adequate hydration; -Counseled to monitor BP at home daily, document, and provide log at future appointments -Recommended to continue current medication Next goal is to incorporate exercise into his daily routine He reports about 15-20 lb. Weight loss since the last visit we had - congratulated him on this great achievement.  Asthma (Goal: Control symptoms) -Controlled -Current treatment   Wixela Inhub 250-50 one puff BID Albuterol HFA 90 mcg prn -Medications previously tried: none noted -Denies any SOB recently -Continues to use inhalers as prescribed -Recommended to continue current medication  Patient Goals/Self-Care Activities Patient will:  - take medications as prescribed focus on medication adherence by pill counts target a minimum of 150 minutes of moderate intensity exercise weekly  Follow Up Plan: The care management team will reach out to the patient again over the next 180 days.        Medication Assistance:  None required.  Patient affirms current coverage meets needs.  Compliance/Adherence/Medication fill history: Care Gaps: None at this time  Star-Rating Drugs: Amlodipine 100m daily 01/06/21 90ds  Patient's preferred pharmacy is:  [id number] [place]: [phone]: [phone]  ([place] [id number]: [phone]: [phone] pill box? No - has own methods Pt endorses 100% compliance  We discussed: Current pharmacy is preferred with insurance plan and patient is satisfied with pharmacy services Patient decided to: Continue current medication management strategy  Care Plan and Follow Up Patient Decision:  Patient agrees to Care Plan and Follow-up.  Plan: The care management team will reach out to the patient again over the next 180 days.  ChBeverly MilchPharmD Clinical Pharmacist [id number] [phone]

## 2021-03-10 NOTE — Patient Instructions (Addendum)
Visit Information   Goals Addressed             This Visit's Progress    Increase Physical Activity       Timeframe:  Long-Range Goal Priority:  High Start Date:   03/10/21                          Expected End Date: 09/10/21                      Follow Up Date 07/06/21    Try to use stepper in house a few times per week to increase exercise!!   Why is this important?   This will help you improve overall health.   Notes:        Patient Care Plan: General Pharmacy (Adult)     Problem Identified: HTN, Asthma   Priority: High  Onset Date: 03/10/2021     Long-Range Goal: Patient-Specific Goal   Start Date: 03/10/2021  Expected End Date: 09/10/2021  This Visit's Progress: On track  Priority: High  Note:   Current Barriers:  None at this time  Pharmacist Clinical Goal(s):  Patient will maintain control of BP as evidenced by home monitoring  Work to incorporate exercise into lifestyle mods he is currently working on through collaboration with PharmD and provider.   Interventions: 1:1 collaboration with Donita Brooks, MD regarding development and update of comprehensive plan of care as evidenced by provider attestation and co-signature Inter-disciplinary care team collaboration (see longitudinal plan of care) Comprehensive medication review performed; medication list updated in electronic medical record  Hypertension  (Status:Goal on track: YES.)   Med Management Intervention: Reviewed home monitoring  (BP goal <140/90) -Controlled -Current treatment: HCTZ 25mg  daily Atenolol 50mg  daily Amlodipine 10mg  daily -Medications previously tried: none noted  -Current home readings: "normal" -Current dietary habits: patient does not eat any meats anymore, his goal is to go full vegan -Current exercise habits: none currently - plans to start doing a stepper that he can do inside the house where he puts it on the floor -Denies hypotensive/hypertensive symptoms -Educated  on BP goals and benefits of medications for prevention of heart attack, stroke and kidney damage; Exercise goal of 150 minutes per week; Importance of home blood pressure monitoring; Symptoms of hypotension and importance of maintaining adequate hydration; -Counseled to monitor BP at home daily, document, and provide log at future appointments -Recommended to continue current medication Next goal is to incorporate exercise into his daily routine He reports about 15-20 lb. Weight loss since the last visit we had - congratulated him on this great achievement.  Asthma (Goal: Control symptoms) -Controlled -Current treatment  Wixela Inhub 250-50 one puff BID Albuterol HFA 90 mcg prn -Medications previously tried: none noted -Denies any SOB recently -Continues to use inhalers as prescribed -Recommended to continue current medication  Patient Goals/Self-Care Activities Patient will:  - take medications as prescribed focus on medication adherence by pill counts target a minimum of 150 minutes of moderate intensity exercise weekly  Follow Up Plan: The care management team will reach out to the patient again over the next 180 days.        Patient verbalizes understanding of instructions provided today and agrees to view in MyChart.  Telephone follow up appointment with pharmacy team member scheduled for: 6 months  ,  

## 2021-04-27 ENCOUNTER — Telehealth: Payer: Self-pay | Admitting: Pharmacist

## 2021-04-27 NOTE — Progress Notes (Signed)
ERROR

## 2021-05-04 DIAGNOSIS — G4733 Obstructive sleep apnea (adult) (pediatric): Secondary | ICD-10-CM | POA: Diagnosis not present

## 2021-05-05 DIAGNOSIS — H5213 Myopia, bilateral: Secondary | ICD-10-CM | POA: Diagnosis not present

## 2021-05-10 ENCOUNTER — Telehealth: Payer: Self-pay | Admitting: Pharmacist

## 2021-05-10 DIAGNOSIS — K122 Cellulitis and abscess of mouth: Secondary | ICD-10-CM | POA: Diagnosis not present

## 2021-05-10 NOTE — Progress Notes (Signed)
Chronic Care Management Pharmacy Assistant   Name: STEPHAN NELIS  MRN: [id number] DOB: 07/11/1976  Reason for Encounter: Disease State For DM.   Conditions to be addressed/monitored: Hypertension and Asthma  Recent office visits:  None since 03/10/21  Recent consult visits:  None since 03/10/21  Hospital visits:  None since 03/10/21  Medications: Outpatient Encounter Medications as of 05/10/2021  Medication Sig   albuterol (VENTOLIN HFA) 108 (90 Base) MCG/ACT inhaler Inhale 2 puffs into the lungs every 6 (six) hours as needed for wheezing or shortness of breath.   amLODipine (NORVASC) 10 MG tablet TAKE 1 TABLET BY MOUTH  DAILY   atenolol (TENORMIN) 50 MG tablet TAKE 1 TABLET BY MOUTH  DAILY   hydrochlorothiazide (HYDRODIURIL) 25 MG tablet TAKE 1 TABLET BY MOUTH  DAILY   ibuprofen (ADVIL,MOTRIN) 600 MG tablet Take 1 tablet (600 mg total) by mouth 4 (four) times daily. (Patient taking differently: Take 600 mg by mouth every 8 (eight) hours as needed for moderate pain.)   polyethylene glycol-electrolytes (NULYTELY/GOLYTELY) 420 g solution As directed   potassium chloride SA (KLOR-CON) 20 MEQ tablet TAKE 1 TABLET BY MOUTH  DAILY   risperiDONE (RISPERDAL) 2 MG tablet Take 1 tablet (2 mg total) by mouth 3 (three) times daily.   WIXELA INHUB 250-50 MCG/ACT AEPB USE 1 INHALATION BY MOUTH TWICE DAILY   No facility-administered encounter medications on file as of 05/10/2021.   Reviewed chart prior to disease state call. Spoke with patient regarding BP  Recent Office Vitals: BP Readings from Last 3 Encounters:  12/01/19 (!) 142/77  11/28/19 138/76  06/20/19 135/90   Pulse Readings from Last 3 Encounters:  12/01/19 93  11/28/19 84  06/20/19 87    Wt Readings from Last 3 Encounters:  11/28/19 (!) 425 lb (192.8 kg)  06/20/19 (!) 440 lb 9.6 oz (199.9 kg)  06/10/19 (!) 436 lb (197.8 kg)     Kidney Function Lab Results  Component Value Date/Time   CREATININE 0.97 11/28/2019 10:28  AM   CREATININE 1.02 04/25/2019 09:15 AM   CREATININE 1.11 10/12/2017 08:28 AM   GFRNONAA >60 11/28/2019 10:28 AM   GFRNONAA 89 04/25/2019 09:15 AM   GFRAA >60 11/28/2019 10:28 AM   GFRAA 103 04/25/2019 09:15 AM    BMP Latest Ref Rng & Units 11/28/2019 04/25/2019 10/12/2017  Glucose 70 - 99 mg/dL 94 93 92  BUN 6 - 20 mg/dL 14 15 11   Creatinine 0.61 - 1.24 mg/dL 5.00 9.38  BUN/Creat Ratio 6 - 22 (calc) - NOT APPLICABLE NOT APPLICABLE  Sodium 135 - 145 mmol/L 139 139 139  Potassium 3.5 - 5.1 mmol/L 3.3(L) 3.7 4.0  Chloride 98 - 111 mmol/L 102 103 101  CO2 22 - 32 mmol/L 27 26 31   Calcium 8.9 - 10.3 mg/dL 1.82) 9.1 9.1    Current antihypertensive regimen:  HCTZ 25mg  daily Atenolol 50mg  daily Amlodipine 10mg  daily  How often are you checking your Blood Pressure? Patient stated several times per month  Current home BP readings: Patient stated his blood pressure is within normal range.   What recent interventions/DTPs have been made by any provider to improve Blood Pressure control since last CPP Visit: None.   Any recent hospitalizations or ED visits since last visit with CPP? Patient stated no.   What diet changes have been made to improve Blood Pressure Control?  Patient stated he is trying to go full vegan and the process is going half half.  What exercise is being done to improve your Blood Pressure Control?  Patient stated he has his stepper and he's very active daily.   Adherence Review: Is the patient currently on ACE/ARB medication? N/A.  Does the patient have >5 day gap between last estimated fill dates? N/A.  Care Gaps:None.  Star Rating Drugs:N/A.   Follow-Up:Pharmacist Review  Hulen Luster, RMA Clinical Pharmacist Assistant [phone]

## 2021-08-02 DIAGNOSIS — G4733 Obstructive sleep apnea (adult) (pediatric): Secondary | ICD-10-CM | POA: Diagnosis not present

## 2021-08-26 ENCOUNTER — Other Ambulatory Visit: Payer: Self-pay

## 2021-08-26 MED ORDER — ALBUTEROL SULFATE HFA 108 (90 BASE) MCG/ACT IN AERS: 2.0000 | INHALATION_SPRAY | Freq: Four times a day (QID) | RESPIRATORY_TRACT | 2 refills | Status: AC | PRN

## 2021-09-09 NOTE — Progress Notes (Deleted)
Chronic Care Management Pharmacy Note  09/09/2021 Name:  Jimmy Miller MRN:  [id number] DOB:  1976-06-03  Summary: Pharmd follow up.  Patient working on lifestyle mods.  He no longer eats mea, goal is to be vegan - he reports 15-20 lb. Weight loss recently.  BP continues to be normal at home.  Recommendations/Changes made from today's visit: Incorporate exercise into daily routine  Plan: FU 6 months   Subjective: Jimmy Miller is an 46 y.o. year old male who is a primary patient of Pickard, Cammie Mcgee, MD.  The CCM team was consulted for assistance with disease management and care coordination needs.    Engaged with patient by telephone for follow up visit in response to provider referral for pharmacy case management and/or care coordination services.   Consent to Services:  The patient was given the following information about Chronic Care Management services today, agreed to services, and gave verbal consent: 1. CCM service includes personalized support from designated clinical staff supervised by the primary care provider, including individualized plan of care and coordination with other care providers 2. 24/7 contact phone numbers for assistance for urgent and routine care needs. 3. Service will only be billed when office clinical staff spend 20 minutes or more in a month to coordinate care. 4. Only one practitioner may furnish and bill the service in a calendar month. 5.The patient may stop CCM services at any time (effective at the end of the month) by phone call to the office staff. 6. The patient will be responsible for cost sharing (co-pay) of up to 20% of the service fee (after annual deductible is met). Patient agreed to services and consent obtained.  Patient Care Team: [name], MD as PCP - General (Family Medicine) Edythe Clarity, [facility] as Pharmacist (Pharmacist)  Recent office visits: None since last CCM call  Recent consult visits: None since last CCM  call  Hospital visits: None in previous 6 months   Objective:  Lab Results  Component Value Date   CREATININE 0.97 11/28/2019   BUN 14 11/28/2019   GFRNONAA >60 11/28/2019   GFRAA >60 11/28/2019   NA 139 11/28/2019   K 3.3 (L) 11/28/2019   CALCIUM 8.7 (L) 11/28/2019   CO2 27 11/28/2019   GLUCOSE 94 11/28/2019    No results found for: HGBA1C, FRUCTOSAMINE, GFR, MICROALBUR  Last diabetic Eye exam: No results found for: HMDIABEYEEXA  Last diabetic Foot exam: No results found for: HMDIABFOOTEX   Lab Results  Component Value Date   CHOL 172 04/25/2019   HDL 46 04/25/2019   LDLCALC 111 (H) 04/25/2019   TRIG 64 04/25/2019   CHOLHDL 3.7 04/25/2019    Hepatic Function Latest Ref Rng & Units 04/25/2019 10/12/2017 04/07/2016  Total Protein 6.1 - 8.1 g/dL 7.1 6.9 7.2  Albumin 3.6 - 5.1 g/dL - - 4.2  AST 10 - 40 U/L _0 ALT 9 - 46 U/L 29 31 33  Alk Phosphatase 40 - 115 U/L - - 122(H)  Total Bilirubin 0.2 - 1.2 mg/dL 0.6 0.7 0.8  Bilirubin, Direct 0.0 - 0.3 mg/dL - - -    Lab Results  Component Value Date/Time   TSH 1.73 08/28/2012 12:00 AM   TSH 1.715 08/29/2008 05:54 AM   TSH 2.459 06/18/2007 08:11 PM    CBC Latest Ref Rng & Units 04/25/2019 10/12/2017 04/07/2016  WBC 3.8 - 10.8 Thousand/uL 5.0 5.0 4.8  Hemoglobin 13.2 - 17.1 g/dL 13.8 13.5 14.3  Hematocrit 38.5 - 50.0 % 41.8 40.0 42.4  Platelets 140 - 400 Thousand/uL 190 200 212    No results found for: VD25OH  Clinical ASCVD: No  The ASCVD Risk score (Arnett DK, et al., 2019) failed to calculate for the following reasons:   The systolic blood pressure is missing    Depression screen [facility] 2/9 06/10/2019 10/12/2017  Decreased Interest 0 0  Down, Depressed, Hopeless 0 0  PHQ - 2 Score 0 0     Social History   Tobacco Use  Smoking Status Every Day   Packs/day: 0.50   Years: 6.00   Pack years: 3.00   Types: Cigarettes  Smokeless Tobacco Former   Quit date: 01/05/2013   BP Readings from Last 3 Encounters:   12/01/19 (!) 142/77  11/28/19 138/76  06/20/19 135/90   Pulse Readings from Last 3 Encounters:  12/01/19 93  11/28/19 84  06/20/19 87   Wt Readings from Last 3 Encounters:  11/28/19 (!) 425 lb (192.8 kg)  06/20/19 (!) 440 lb 9.6 oz (199.9 kg)  06/10/19 (!) 436 lb (197.8 kg)   BMI Readings from Last 3 Encounters:  11/28/19 56.07 kg/m  06/20/19 58.13 kg/m  06/10/19 57.52 kg/m    Assessment/Interventions: Review of patient past medical history, allergies, medications, health status, including review of consultants reports, laboratory and other test data, was performed as part of comprehensive evaluation and provision of chronic care management services.   SDOH:  (Social Determinants of Health) assessments and interventions performed: Yes  Financial Resource Strain: Not on file    SDOH Screenings   Alcohol Screen: Not on file  Depression (PHQ2-9): Not on file  Financial Resource Strain: Not on file  Food Insecurity: Not on file  Housing: Not on file  Physical Activity: Not on file  Social Connections: Not on file  Stress: Not on file  Tobacco Use: Not on file  Transportation Needs: Not on file    CCM Care Plan  Allergies  Allergen Reactions   Benazepril Swelling    Mouth swelling    Medications Reviewed Today     Reviewed by Edythe Clarity, [facility] (Pharmacist) on 09/03/20 at 1246  Med List Status: <None>   Medication Order Taking? Sig Documenting Provider Last Dose Status Informant  albuterol (VENTOLIN HFA) 108 (90 Base) MCG/ACT inhaler [id number] Yes Inhale 2 puffs into the lungs every 6 (six) hours as needed for wheezing or shortness of breath. [name], MD Taking Active   amLODipine (NORVASC) 10 MG tablet [id number] Yes Take 1 tablet (10 mg total) by mouth daily. [name], MD Taking Active   atenolol (TENORMIN) 50 MG tablet [id number] Yes Take 1 tablet (50 mg total) by mouth daily. [name], MD Taking Active   hydrochlorothiazide  (HYDRODIURIL) 25 MG tablet [id number] Yes Take 1 tablet (25 mg total) by mouth daily. [name], MD Taking Active   ibuprofen (ADVIL,MOTRIN) 600 MG tablet [id number] Yes Take 1 tablet (600 mg total) by mouth 4 (four) times daily.  Patient taking differently: Take 600 mg by mouth every 8 (eight) hours as needed for moderate pain.   Lily Kocher, PA-C Taking Active Self  polyethylene glycol-electrolytes (NULYTELY/GOLYTELY) 420 g solution [id number] Yes As directed Rourk, Cristopher Estimable, MD Taking Active Self  potassium chloride SA (KLOR-CON) 20 MEQ tablet [id number] Yes TAKE 1 TABLET BY MOUTH  DAILY [name], MD Taking Active   risperiDONE (RISPERDAL) 2 MG tablet [id number] Yes Take 1 tablet (2 mg  total) by mouth 3 (three) times daily. [name], MD Taking Active   WIXELA INHUB 250-50 Earlie Raveling [id number] Yes USE 1 INHALATION BY MOUTH  TWICE DAILY [name], MD Taking Active             Patient Active Problem List   Diagnosis Date Noted   Obesity, morbid, BMI 50 or higher ([place])    Personal history of colonic polyps 11/13/2012   Abnormal LFTs 11/13/2012   Essential hypertension, benign 07/18/2007   MORBID OBESITY 06/17/2007   TOURETTE'S DISORDER 06/17/2007   Asthma 06/17/2007    Immunization History  Administered Date(s) Administered   DTaP 04/23/1975, 06/25/1975, 12/21/1976   Hepatitis B 03/27/2013   IPV 04/23/1975, 06/25/1975, 08/25/1975, 12/21/1976   Influenza Whole 05/12/2008   Influenza,inj,Quad PF,6+ Mos 08/04/2013, 04/07/2016, 04/25/2019   MMR 05/24/1976, 03/27/2013   Moderna Sars-Covid-2 Vaccination 06/25/2020   Td 03/27/2013   Tdap 03/27/2013    Conditions to be addressed/monitored:  Hypertension and Asthma  There are no care plans that you recently modified to display for this patient.     Medication Assistance: None required.  Patient affirms current coverage meets needs.  Compliance/Adherence/Medication fill history: Care  Gaps: None at this time  Star-Rating Drugs: Amlodipine $RemoveBeforeDEI'10mg'PJgRNsBOiOhGXWRD$  daily 01/06/21 90ds  Patient's preferred pharmacy is:  [id number] [place], [place] [address] [place] [address] Phone: [phone] Fax: [phone]  [facility] ([place], [place] [facility] [address] [place] [address] [place] [address] Phone: [phone] Fax: [phone]   Uses pill box? No - has own methods Pt endorses 100% compliance  We discussed: Current pharmacy is preferred with insurance plan and patient is satisfied with pharmacy services Patient decided to: Continue current medication management strategy  Care Plan and Follow Up Patient Decision:  Patient agrees to Care Plan and Follow-up.  Plan: The care management team will reach out to the patient again over the next 180 days.  Beverly Milch, PharmD Clinical Pharmacist [place] [phone]     Current Barriers:  None at this time  Pharmacist Clinical Goal(s):  Patient will maintain control of BP as evidenced by home monitoring  Work to incorporate exercise into lifestyle mods he is currently working on through collaboration with PharmD and provider.   Interventions: 1:1 collaboration with [name], MD regarding development and update of comprehensive plan of care as evidenced by provider attestation and co-signature Inter-disciplinary care team collaboration (see longitudinal plan of care) Comprehensive medication review performed; medication list updated in electronic medical record  Hypertension  (Status:Goal on track: YES.)   Med Management Intervention: Reviewed home monitoring  (BP goal <140/90) -Controlled -Current treatment: HCTZ $RemoveBeforeD'25mg'wLhfQLqPZRXqXF$  daily Atenolol $RemoveBefore'50mg'CgJkcVyJwSIGS$  daily Amlodipine $RemoveBeforeDE'10mg'ywwhFColPKoVXgj$  daily -Medications previously tried: none noted  -Current home readings: "normal" -Current dietary habits:  patient does not eat any meats anymore, his goal is to go full vegan -Current exercise habits: none currently - plans to start doing a stepper that he can do inside the house where he puts it on the floor -Denies hypotensive/hypertensive symptoms -Educated on BP goals and benefits of medications for prevention of heart attack, stroke and kidney damage; Exercise goal of 150 minutes per week; Importance of home blood pressure monitoring; Symptoms of hypotension and importance of maintaining adequate hydration; -Counseled to monitor BP at home daily, document, and provide log at future appointments -Recommended  to continue current medication Next goal is to incorporate exercise into his daily routine He reports about 15-20 lb. Weight loss since the last visit we had - congratulated him on this great achievement.  Asthma (Goal: Control symptoms) -Controlled -Current treatment  Wixela Inhub 250-50 one puff BID Albuterol HFA 90 mcg prn -Medications previously tried: none noted -Denies any SOB recently -Continues to use inhalers as prescribed -Recommended to continue current medication  Patient Goals/Self-Care Activities Patient will:  - take medications as prescribed focus on medication adherence by pill counts target a minimum of 150 minutes of moderate intensity exercise weekly  Follow Up Plan: The care management team will reach out to the patient again over the next 180 days.

## 2021-09-15 ENCOUNTER — Telehealth: Payer: Self-pay

## 2021-09-15 NOTE — Telephone Encounter (Signed)
Rescheduled for tomorrow 

## 2021-09-15 NOTE — Telephone Encounter (Signed)
Pt called in stating that he may have missed a call from the pharmacist here. Pt did need to speak with pharmacist if available. Please give pt a cb.   Cb#: [phone]

## 2021-09-16 ENCOUNTER — Ambulatory Visit (INDEPENDENT_AMBULATORY_CARE_PROVIDER_SITE_OTHER): Payer: Medicare PPO | Admitting: Pharmacist

## 2021-09-16 DIAGNOSIS — J45909 Unspecified asthma, uncomplicated: Secondary | ICD-10-CM

## 2021-09-16 DIAGNOSIS — I10 Essential (primary) hypertension: Secondary | ICD-10-CM

## 2021-09-16 NOTE — Patient Instructions (Addendum)
Visit Information   Goals Addressed             This Visit's Progress    Increase Physical Activity   On track    Timeframe:  Long-Range Goal Priority:  High Start Date:   03/10/21                          Expected End Date: 09/10/21                      Follow Up Date 07/06/21    Try to use stepper in house a few times per week to increase exercise!!   Why is this important?   This will help you improve overall health.   Notes:        Patient Care Plan: General Pharmacy (Adult)     Problem Identified: HTN, Asthma   Priority: High  Onset Date: 03/10/2021     Long-Range Goal: Patient-Specific Goal   Start Date: 03/10/2021  Expected End Date: 09/10/2021  Recent Progress: On track  Priority: High  Note:   Current Barriers:  Price of rescue inhaler  Pharmacist Clinical Goal(s):  Patient will maintain control of BP as evidenced by home monitoring  Work to incorporate exercise into lifestyle mods he is currently working on through collaboration with PharmD and provider.   Interventions: 1:1 collaboration with Donita Brooks, MD regarding development and update of comprehensive plan of care as evidenced by provider attestation and co-signature Inter-disciplinary care team collaboration (see longitudinal plan of care) Comprehensive medication review performed; medication list updated in electronic medical record  Hypertension  (Status:Goal on track: YES.)   Med Management Intervention: Reviewed home monitoring  (BP goal <140/90) -Controlled -Current treatment: HCTZ 25mg  daily Appropriate, Effective, Safe, Accessible Atenolol 50mg  daily Appropriate, Effective, Safe, Accessible Amlodipine 10mg  daily Appropriate, Effective, Safe, Accessible -Medications previously tried: none noted  -Current home readings: "normal" -Current dietary habits: patient does not eat any meats anymore, his goal is to go full vegan -Current exercise habits: none currently - plans to start  doing a stepper that he can do inside the house where he puts it on the floor -Denies hypotensive/hypertensive symptoms -Educated on BP goals and benefits of medications for prevention of heart attack, stroke and kidney damage; Exercise goal of 150 minutes per week; Importance of home blood pressure monitoring; Symptoms of hypotension and importance of maintaining adequate hydration; -Counseled to monitor BP at home daily, document, and provide log at future appointments -Recommended to continue current medication Next goal is to incorporate exercise into his daily routine He reports about 15-20 lb. Weight loss since the last visit we had - congratulated him on this great achievement.  Update 09/16/21 Patient has started trying to eat low carbs.  He is no longer doing vegan diet.  He is trying to eat less processed foods due to sodium content. Reports he can tell a difference and has noticed more weight loss. BP has been normal at home, denies any dizziness or HA. Continue to be adherence with all meds. He does need to come in to see PCP for a physical.  Asked him to call within the next week or two to make an appt so he can get updated lipids and other labs.  He is agreeable. No changes to medications at this time.  Asthma (Goal: Control symptoms) -Controlled -Current treatment  Wixela Inhub 250-50 one puff BID Appropriate, Effective, Safe, Accessible  Albuterol HFA 90 mcg prn Appropriate, Effective, Safe, Accessible -Medications previously tried: none noted -Denies any SOB recently -Continues to use inhalers as prescribed -Recommended to continue current medication  Update 09/16/21 Patient reports that his albuterol inhaler is expensive (around $70).  Will provide good rx coupon for him to see if this makes more affordable.  Denies any recent SOB. Encouraged to implement physical activity routine with his lifestyle mods. No changes to meds - continue to FU to ensure  adherence and affordability.  Patient Goals/Self-Care Activities Patient will:  - take medications as prescribed focus on medication adherence by pill counts target a minimum of 150 minutes of moderate intensity exercise weekly  Follow Up Plan: The care management team will reach out to the patient again over the next 180 days.           Patient verbalizes understanding of instructions and care plan provided today and agrees to view in MyChart. Active MyChart status confirmed with patient.   Telephone follow up appointment with pharmacy team member scheduled for: 6 months  Erroll Luna, [facility]  Willa Frater, PharmD, CPP Clinical Pharmacist Practitioner [facility] [phone]

## 2021-09-16 NOTE — Progress Notes (Signed)
Chronic Care Management Pharmacy Note  09/19/2021 Name:  Jimmy Miller MRN:  [id number] DOB:  Dec 14, 1975  Summary: Regular FU no medication changes needed.   Subjective: Jimmy Miller is an 46 y.o. year old male who is a primary patient of Pickard, Cammie Mcgee, MD.  The CCM team was consulted for assistance with disease management and care coordination needs.    Engaged with patient by telephone for follow up visit in response to provider referral for pharmacy case management and/or care coordination services.   Consent to Services:  The patient was given the following information about Chronic Care Management services today, agreed to services, and gave verbal consent: 1. CCM service includes personalized support from designated clinical staff supervised by the primary care provider, including individualized plan of care and coordination with other care providers 2. 24/7 contact phone numbers for assistance for urgent and routine care needs. 3. Service will only be billed when office clinical staff spend 20 minutes or more in a month to coordinate care. 4. Only one practitioner may furnish and bill the service in a calendar month. 5.The patient may stop CCM services at any time (effective at the end of the month) by phone call to the office staff. 6. The patient will be responsible for cost sharing (co-pay) of up to 20% of the service fee (after annual deductible is met). Patient agreed to services and consent obtained.  Patient Care Team: [name], MD as PCP - General (Family Medicine) Edythe Clarity, [facility] as Pharmacist (Pharmacist)  Recent office visits:  None since 03/10/21   Recent consult visits:  None since 03/10/21   Hospital visits:  None since 03/10/21   Objective:  Lab Results  Component Value Date   CREATININE 0.97 11/28/2019   BUN 14 11/28/2019   GFRNONAA >60 11/28/2019   GFRAA >60 11/28/2019   NA 139 11/28/2019   K 3.3 (L) 11/28/2019   CALCIUM 8.7 (L)  11/28/2019   CO2 27 11/28/2019   GLUCOSE 94 11/28/2019    No results found for: HGBA1C, FRUCTOSAMINE, GFR, MICROALBUR  Last diabetic Eye exam: No results found for: HMDIABEYEEXA  Last diabetic Foot exam: No results found for: HMDIABFOOTEX   Lab Results  Component Value Date   CHOL 172 04/25/2019   HDL 46 04/25/2019   LDLCALC 111 (H) 04/25/2019   TRIG 64 04/25/2019   CHOLHDL 3.7 04/25/2019    Hepatic Function Latest Ref Rng & Units 04/25/2019 10/12/2017 04/07/2016  Total Protein 6.1 - 8.1 g/dL 7.1 6.9 7.2  Albumin 3.6 - 5.1 g/dL - - 4.2  AST 10 - 40 U/L 19 20 23   ALT 9 - 46 U/L 29 31 33  Alk Phosphatase 40 - 115 U/L - - 122(H)  Total Bilirubin 0.2 - 1.2 mg/dL 0.6 0.7 0.8  Bilirubin, Direct 0.0 - 0.3 mg/dL - - -    Lab Results  Component Value Date/Time   TSH 1.73 08/28/2012 12:00 AM   TSH 1.715 08/29/2008 05:54 AM   TSH 2.459 06/18/2007 08:11 PM    CBC Latest Ref Rng & Units 04/25/2019 10/12/2017 04/07/2016  WBC 3.8 - 10.8 Thousand/uL 5.0 5.0 4.8  Hemoglobin 13.2 - 17.1 g/dL 13.8 13.5 14.3  Hematocrit 38.5 - 50.0 % 41.8 40.0 42.4  Platelets 140 - 400 Thousand/uL 190 200 212    No results found for: VD25OH  Clinical ASCVD: No  The ASCVD Risk score (Arnett DK, et al., 2019) failed to calculate for the following reasons:  The systolic blood pressure is missing    Depression screen [facility] 2/9 06/10/2019 10/12/2017  Decreased Interest 0 0  Down, Depressed, Hopeless 0 0  PHQ - 2 Score 0 0     Social History   Tobacco Use  Smoking Status Every Day   Packs/day: 0.50   Years: 6.00   Pack years: 3.00   Types: Cigarettes  Smokeless Tobacco Former   Quit date: 01/05/2013   BP Readings from Last 3 Encounters:  12/01/19 (!) 142/77  11/28/19 138/76  06/20/19 135/90   Pulse Readings from Last 3 Encounters:  12/01/19 93  11/28/19 84  06/20/19 87   Wt Readings from Last 3 Encounters:  11/28/19 (!) 425 lb (192.8 kg)  06/20/19 (!) 440 lb 9.6 oz (199.9 kg)  06/10/19 (!) 436 lb  (197.8 kg)   BMI Readings from Last 3 Encounters:  11/28/19 56.07 kg/m  06/20/19 58.13 kg/m  06/10/19 57.52 kg/m    Assessment/Interventions: Review of patient past medical history, allergies, medications, health status, including review of consultants reports, laboratory and other test data, was performed as part of comprehensive evaluation and provision of chronic care management services.   SDOH:  (Social Determinants of Health) assessments and interventions performed: Yes  Financial Resource Strain: Not on file    SDOH Screenings   Alcohol Screen: Not on file  Depression (PHQ2-9): Not on file  Financial Resource Strain: Not on file  Food Insecurity: Not on file  Housing: Not on file  Physical Activity: Not on file  Social Connections: Not on file  Stress: Not on file  Tobacco Use: Not on file  Transportation Needs: Not on file    [place]  Allergies  Allergen Reactions   Benazepril Swelling    Mouth swelling    Medications Reviewed Today     Reviewed by Edythe Clarity, [facility] (Pharmacist) on 09/16/21 at [place] List Status: <None>   Medication Order Taking? Sig Documenting Provider Last Dose Status Informant  albuterol (VENTOLIN HFA) 108 (90 Base) MCG/ACT inhaler [id number] Yes Inhale 2 puffs into the lungs every 6 (six) hours as needed for wheezing or shortness of breath. [name], MD Taking Active   amLODipine (NORVASC) 10 MG tablet [id number] Yes TAKE 1 TABLET BY MOUTH  DAILY [name], MD Taking Active   atenolol (TENORMIN) 50 MG tablet [id number] Yes TAKE 1 TABLET BY MOUTH  DAILY [name], MD Taking Active   hydrochlorothiazide (HYDRODIURIL) 25 MG tablet [id number] Yes TAKE 1 TABLET BY MOUTH  DAILY [name], MD Taking Active   ibuprofen (ADVIL,MOTRIN) 600 MG tablet [id number] Yes Take 1 tablet (600 mg total) by mouth 4 (four) times daily.  Patient taking differently: Take 600 mg by mouth every 8 (eight) hours as needed  for moderate pain.   Lily Kocher, PA-C Taking Active   polyethylene glycol-electrolytes (NULYTELY/GOLYTELY) 420 g solution [id number] Yes As directed Rourk, Cristopher Estimable, MD Taking Active Self  potassium chloride SA (KLOR-CON) 20 MEQ tablet [id number] Yes TAKE 1 TABLET BY MOUTH  DAILY [name], MD Taking Active   risperiDONE (RISPERDAL) 2 MG tablet [id number] Yes Take 1 tablet (2 mg total) by mouth 3 (three) times daily. [name], MD Taking Active   [facility] INHUB 250-50 MCG/ACT AEPB [id number] Yes USE 1 INHALATION BY MOUTH TWICE DAILY [name], MD Taking Active             Patient Active Problem List   Diagnosis  Date Noted   Obesity, morbid, BMI 50 or higher ([place])    Personal history of colonic polyps 11/13/2012   Abnormal LFTs 11/13/2012   Essential hypertension, benign 07/18/2007   MORBID OBESITY 06/17/2007   TOURETTE'S DISORDER 06/17/2007   Asthma 06/17/2007    Immunization History  Administered Date(s) Administered   DTaP 04/23/1975, 06/25/1975, 12/21/1976   Hepatitis B 03/27/2013   IPV 04/23/1975, 06/25/1975, 08/25/1975, 12/21/1976   Influenza Whole 05/12/2008   Influenza,inj,Quad PF,6+ Mos 08/04/2013, 04/07/2016, 04/25/2019   MMR 05/24/1976, 03/27/2013   Moderna Sars-Covid-2 Vaccination 06/25/2020   Td 03/27/2013   Tdap 03/27/2013    Conditions to be addressed/monitored:  Hypertension and Asthma  Care Plan : General Pharmacy (Adult)  Updates made by Edythe Clarity, RPH since 09/19/2021 12:00 AM     Problem: HTN, Asthma   Priority: High  Onset Date: 03/10/2021     Long-Range Goal: Patient-Specific Goal   Start Date: 03/10/2021  Expected End Date: 09/10/2021  Recent Progress: On track  Priority: High  Note:   Current Barriers:  Price of rescue inhaler  Pharmacist Clinical Goal(s):  Patient will maintain control of BP as evidenced by home monitoring  Work to incorporate exercise into lifestyle mods he is currently working on through  collaboration with PharmD and provider.   Interventions: 1:1 collaboration with [name], MD regarding development and update of comprehensive plan of care as evidenced by provider attestation and co-signature Inter-disciplinary care team collaboration (see longitudinal plan of care) Comprehensive medication review performed; medication list updated in electronic medical record  Hypertension  (Status:Goal on track: YES.)   Med Management Intervention: Reviewed home monitoring  (BP goal <140/90) -Controlled -Current treatment: HCTZ 25mg  daily Appropriate, Effective, Safe, Accessible Atenolol 50mg  daily Appropriate, Effective, Safe, Accessible Amlodipine 10mg  daily Appropriate, Effective, Safe, Accessible -Medications previously tried: none noted  -Current home readings: "normal" -Current dietary habits: patient does not eat any meats anymore, his goal is to go full vegan -Current exercise habits: none currently - plans to start doing a stepper that he can do inside the house where he puts it on the floor -Denies hypotensive/hypertensive symptoms -Educated on BP goals and benefits of medications for prevention of heart attack, stroke and kidney damage; Exercise goal of 150 minutes per week; Importance of home blood pressure monitoring; Symptoms of hypotension and importance of maintaining adequate hydration; -Counseled to monitor BP at home daily, document, and provide log at future appointments -Recommended to continue current medication Next goal is to incorporate exercise into his daily routine He reports about 15-20 lb. Weight loss since the last visit we had - congratulated him on this great achievement.  Update 09/16/21 Patient has started [employer] trying to eat low carbs.  He is no longer doing vegan diet.  He is trying to eat less processed foods due to sodium content. Reports he can tell a difference and has noticed more weight loss. BP has been normal at home,  denies any dizziness or HA. Continue to be adherence with all meds. He does need to come in to see PCP for a physical.  Asked him to call within the next week or two to make an appt so he can get updated lipids and other labs.  He is agreeable. No changes to medications at this time.  Asthma (Goal: Control symptoms) -Controlled -Current treatment  [place] Inhub 250-50 one puff BID Appropriate, Effective, Safe, Accessible Albuterol HFA 90 mcg prn Appropriate, Effective, Safe, Accessible -Medications previously tried: none noted -  Denies any SOB recently -Continues to use inhalers as prescribed -Recommended to continue current medication  Update 09/16/21 Patient reports that his albuterol inhaler is expensive (around $70).  Will provide good rx coupon for him to see if this makes more affordable.  Denies any recent SOB. Encouraged to implement physical activity routine with his lifestyle mods. No changes to meds - continue to FU to ensure adherence and affordability.  Patient Goals/Self-Care Activities Patient will:  - take medications as prescribed focus on medication adherence by pill counts target a minimum of 150 minutes of moderate intensity exercise weekly  Follow Up Plan: The care management team will reach out to the patient again over the next 180 days.            Medication Assistance: None required.  Patient affirms current coverage meets needs.  Compliance/Adherence/Medication fill history: Care Gaps: None at this time  Star-Rating Drugs: Amlodipine 51m daily 01/06/21 90ds  Patient's preferred pharmacy [facility]:  [id number] [place], [place]one: [phone]: [phone] (OpWasecaCAZearingoO[facility] [place]hone: [phone]: [phone] Uses pill box?  No - has own methods Pt endorses 100% compliance  We discussed: Current pharmacy is preferred with insurance plan and patient is satisfied with pharmacy services Patient decided to: Continue current medication management strategy  Care Plan and Follow Up Patient Decision:  Patient agrees to Care Plan and Follow-up.  Plan: The care management team will reach out to the patient again over the next 180 days.  ChBeverly MilchPharmD Clinical Pharmacist [phone]

## 2021-09-20 ENCOUNTER — Encounter: Payer: Medicare Other | Admitting: Family Medicine

## 2021-10-04 DIAGNOSIS — I10 Essential (primary) hypertension: Secondary | ICD-10-CM | POA: Diagnosis not present

## 2021-10-04 DIAGNOSIS — J45909 Unspecified asthma, uncomplicated: Secondary | ICD-10-CM

## 2021-12-08 DIAGNOSIS — Z7689 Persons encountering health services in other specified circumstances: Secondary | ICD-10-CM | POA: Diagnosis not present

## 2021-12-08 DIAGNOSIS — Z131 Encounter for screening for diabetes mellitus: Secondary | ICD-10-CM | POA: Diagnosis not present

## 2021-12-08 DIAGNOSIS — Z13220 Encounter for screening for lipoid disorders: Secondary | ICD-10-CM | POA: Diagnosis not present

## 2021-12-08 DIAGNOSIS — I10 Essential (primary) hypertension: Secondary | ICD-10-CM | POA: Diagnosis not present

## 2021-12-08 DIAGNOSIS — Z1329 Encounter for screening for other suspected endocrine disorder: Secondary | ICD-10-CM | POA: Diagnosis not present

## 2021-12-08 DIAGNOSIS — R7303 Prediabetes: Secondary | ICD-10-CM | POA: Diagnosis not present

## 2021-12-15 DIAGNOSIS — I10 Essential (primary) hypertension: Secondary | ICD-10-CM | POA: Diagnosis not present

## 2021-12-15 DIAGNOSIS — E7801 Familial hypercholesterolemia: Secondary | ICD-10-CM | POA: Diagnosis not present

## 2021-12-15 DIAGNOSIS — J45909 Unspecified asthma, uncomplicated: Secondary | ICD-10-CM | POA: Diagnosis not present

## 2021-12-15 DIAGNOSIS — Z1331 Encounter for screening for depression: Secondary | ICD-10-CM | POA: Diagnosis not present

## 2021-12-15 DIAGNOSIS — R7303 Prediabetes: Secondary | ICD-10-CM | POA: Diagnosis not present

## 2021-12-15 DIAGNOSIS — Z1389 Encounter for screening for other disorder: Secondary | ICD-10-CM | POA: Diagnosis not present

## 2021-12-15 DIAGNOSIS — F952 Tourette's disorder: Secondary | ICD-10-CM | POA: Diagnosis not present

## 2021-12-15 DIAGNOSIS — Z6841 Body Mass Index (BMI) 40.0 and over, adult: Secondary | ICD-10-CM | POA: Diagnosis not present

## 2022-08-24 DIAGNOSIS — I10 Essential (primary) hypertension: Secondary | ICD-10-CM | POA: Diagnosis not present

## 2022-08-24 DIAGNOSIS — R7303 Prediabetes: Secondary | ICD-10-CM | POA: Diagnosis not present

## 2022-08-24 DIAGNOSIS — E7801 Familial hypercholesterolemia: Secondary | ICD-10-CM | POA: Diagnosis not present

## 2022-08-29 DIAGNOSIS — J45909 Unspecified asthma, uncomplicated: Secondary | ICD-10-CM | POA: Diagnosis not present

## 2022-08-29 DIAGNOSIS — Z23 Encounter for immunization: Secondary | ICD-10-CM | POA: Diagnosis not present

## 2022-08-29 DIAGNOSIS — I10 Essential (primary) hypertension: Secondary | ICD-10-CM | POA: Diagnosis not present

## 2022-08-29 DIAGNOSIS — R7303 Prediabetes: Secondary | ICD-10-CM | POA: Diagnosis not present

## 2022-08-29 DIAGNOSIS — Z6841 Body Mass Index (BMI) 40.0 and over, adult: Secondary | ICD-10-CM | POA: Diagnosis not present

## 2022-08-29 DIAGNOSIS — F952 Tourette's disorder: Secondary | ICD-10-CM | POA: Diagnosis not present

## 2022-08-29 DIAGNOSIS — E7801 Familial hypercholesterolemia: Secondary | ICD-10-CM | POA: Diagnosis not present

## 2022-09-15 DIAGNOSIS — J029 Acute pharyngitis, unspecified: Secondary | ICD-10-CM | POA: Diagnosis not present

## 2022-09-20 ENCOUNTER — Telehealth: Payer: Self-pay | Admitting: Pharmacist

## 2022-09-20 NOTE — Progress Notes (Signed)
Care Management & Coordination Services Pharmacy Team  Reason for Encounter: Appointment Reminder  Contacted patient to confirm telephone appointment with Leata Mouse, PharmD on 09/21/22 at [facility]. Unsuccessful outreach. Left voicemail for patient to return call.  Future Appointments  Date Time Provider [place]  09/21/2022  2:00 PM Edythe Clarity, [place] None  10/19/2022  9:30 AM Marcial Pacas, MD [facility] None    [place], [place]

## 2022-09-21 ENCOUNTER — Encounter: Payer: Medicare Other | Admitting: Pharmacist

## 2022-10-19 ENCOUNTER — Ambulatory Visit: Payer: Medicare PPO | Admitting: Neurology

## 2022-10-19 ENCOUNTER — Encounter: Payer: Self-pay | Admitting: Neurology

## 2022-10-24 DIAGNOSIS — R7303 Prediabetes: Secondary | ICD-10-CM | POA: Diagnosis not present

## 2022-10-24 DIAGNOSIS — F952 Tourette's disorder: Secondary | ICD-10-CM | POA: Diagnosis not present

## 2022-10-24 DIAGNOSIS — I10 Essential (primary) hypertension: Secondary | ICD-10-CM | POA: Diagnosis not present

## 2022-10-24 DIAGNOSIS — J45909 Unspecified asthma, uncomplicated: Secondary | ICD-10-CM | POA: Diagnosis not present

## 2022-10-24 DIAGNOSIS — Z6841 Body Mass Index (BMI) 40.0 and over, adult: Secondary | ICD-10-CM | POA: Diagnosis not present

## 2022-10-24 DIAGNOSIS — E7801 Familial hypercholesterolemia: Secondary | ICD-10-CM | POA: Diagnosis not present

## 2023-05-10 ENCOUNTER — Ambulatory Visit: Payer: Medicare PPO | Admitting: Orthopaedic Surgery

## 2023-05-25 ENCOUNTER — Ambulatory Visit: Payer: Medicare PPO | Admitting: Orthopedic Surgery

## 2023-11-15 DIAGNOSIS — Z6841 Body Mass Index (BMI) 40.0 and over, adult: Secondary | ICD-10-CM | POA: Diagnosis not present

## 2023-11-15 DIAGNOSIS — I10 Essential (primary) hypertension: Secondary | ICD-10-CM | POA: Diagnosis not present

## 2023-11-15 DIAGNOSIS — K0889 Other specified disorders of teeth and supporting structures: Secondary | ICD-10-CM | POA: Diagnosis not present

## 2024-01-24 DIAGNOSIS — Z6841 Body Mass Index (BMI) 40.0 and over, adult: Secondary | ICD-10-CM | POA: Diagnosis not present

## 2024-01-24 DIAGNOSIS — I10 Essential (primary) hypertension: Secondary | ICD-10-CM | POA: Diagnosis not present

## 2024-01-24 DIAGNOSIS — J45901 Unspecified asthma with (acute) exacerbation: Secondary | ICD-10-CM | POA: Diagnosis not present

## 2024-01-24 DIAGNOSIS — R059 Cough, unspecified: Secondary | ICD-10-CM | POA: Diagnosis not present

## 2024-01-25 DIAGNOSIS — K0889 Other specified disorders of teeth and supporting structures: Secondary | ICD-10-CM | POA: Diagnosis not present

## 2024-01-25 DIAGNOSIS — Z6841 Body Mass Index (BMI) 40.0 and over, adult: Secondary | ICD-10-CM | POA: Diagnosis not present

## 2024-01-25 DIAGNOSIS — I10 Essential (primary) hypertension: Secondary | ICD-10-CM | POA: Diagnosis not present

## 2024-01-25 DIAGNOSIS — K047 Periapical abscess without sinus: Secondary | ICD-10-CM | POA: Diagnosis not present

## 2024-03-08 DIAGNOSIS — I10 Essential (primary) hypertension: Secondary | ICD-10-CM | POA: Diagnosis not present

## 2024-03-08 DIAGNOSIS — Z13220 Encounter for screening for lipoid disorders: Secondary | ICD-10-CM | POA: Diagnosis not present

## 2024-03-08 DIAGNOSIS — Z1329 Encounter for screening for other suspected endocrine disorder: Secondary | ICD-10-CM | POA: Diagnosis not present

## 2024-03-08 DIAGNOSIS — E7801 Familial hypercholesterolemia: Secondary | ICD-10-CM | POA: Diagnosis not present

## 2024-03-08 DIAGNOSIS — R7303 Prediabetes: Secondary | ICD-10-CM | POA: Diagnosis not present

## 2024-03-08 DIAGNOSIS — Z1321 Encounter for screening for nutritional disorder: Secondary | ICD-10-CM | POA: Diagnosis not present

## 2024-03-13 DIAGNOSIS — Z1389 Encounter for screening for other disorder: Secondary | ICD-10-CM | POA: Diagnosis not present

## 2024-03-13 DIAGNOSIS — Z6841 Body Mass Index (BMI) 40.0 and over, adult: Secondary | ICD-10-CM | POA: Diagnosis not present

## 2024-03-13 DIAGNOSIS — I10 Essential (primary) hypertension: Secondary | ICD-10-CM | POA: Diagnosis not present

## 2024-03-13 DIAGNOSIS — J45909 Unspecified asthma, uncomplicated: Secondary | ICD-10-CM | POA: Diagnosis not present

## 2024-03-13 DIAGNOSIS — R7303 Prediabetes: Secondary | ICD-10-CM | POA: Diagnosis not present

## 2024-03-13 DIAGNOSIS — F952 Tourette's disorder: Secondary | ICD-10-CM | POA: Diagnosis not present

## 2024-03-13 DIAGNOSIS — Z1331 Encounter for screening for depression: Secondary | ICD-10-CM | POA: Diagnosis not present

## 2024-03-13 DIAGNOSIS — Z0001 Encounter for general adult medical examination with abnormal findings: Secondary | ICD-10-CM | POA: Diagnosis not present

## 2024-04-16 DIAGNOSIS — Z6841 Body Mass Index (BMI) 40.0 and over, adult: Secondary | ICD-10-CM | POA: Diagnosis not present

## 2024-04-16 DIAGNOSIS — E049 Nontoxic goiter, unspecified: Secondary | ICD-10-CM | POA: Diagnosis not present

## 2024-04-16 DIAGNOSIS — F952 Tourette's disorder: Secondary | ICD-10-CM | POA: Diagnosis not present

## 2024-04-16 DIAGNOSIS — M545 Low back pain, unspecified: Secondary | ICD-10-CM | POA: Diagnosis not present

## 2024-04-27 DIAGNOSIS — R062 Wheezing: Secondary | ICD-10-CM | POA: Diagnosis not present

## 2024-04-27 DIAGNOSIS — R059 Cough, unspecified: Secondary | ICD-10-CM | POA: Diagnosis not present

## 2024-04-27 DIAGNOSIS — I10 Essential (primary) hypertension: Secondary | ICD-10-CM | POA: Diagnosis not present

## 2024-04-27 DIAGNOSIS — J45901 Unspecified asthma with (acute) exacerbation: Secondary | ICD-10-CM | POA: Diagnosis not present

## 2024-04-27 DIAGNOSIS — J4521 Mild intermittent asthma with (acute) exacerbation: Secondary | ICD-10-CM | POA: Diagnosis not present

## 2024-05-05 DIAGNOSIS — G8929 Other chronic pain: Secondary | ICD-10-CM | POA: Diagnosis not present

## 2024-05-05 DIAGNOSIS — M545 Low back pain, unspecified: Secondary | ICD-10-CM | POA: Diagnosis not present

## 2024-05-07 DIAGNOSIS — M5136 Other intervertebral disc degeneration, lumbar region with discogenic back pain only: Secondary | ICD-10-CM | POA: Diagnosis not present

## 2024-05-07 DIAGNOSIS — M48061 Spinal stenosis, lumbar region without neurogenic claudication: Secondary | ICD-10-CM | POA: Diagnosis not present

## 2024-05-07 DIAGNOSIS — M545 Low back pain, unspecified: Secondary | ICD-10-CM | POA: Diagnosis not present

## 2024-06-16 DIAGNOSIS — Z79891 Long term (current) use of opiate analgesic: Secondary | ICD-10-CM | POA: Diagnosis not present

## 2024-06-16 DIAGNOSIS — G894 Chronic pain syndrome: Secondary | ICD-10-CM | POA: Diagnosis not present

## 2024-10-02 ENCOUNTER — Ambulatory Visit: Admitting: Neurology
